# Patient Record
Sex: Female | Born: 1985 | Hispanic: Yes | Marital: Single | Smoking: Never smoker
Health system: Southern US, Community
[De-identification: ages and names within clinical notes are randomized; demographics above are authoritative.]

## PROBLEM LIST (undated history)

## (undated) ENCOUNTER — Inpatient Hospital Stay (HOSPITAL_COMMUNITY): Payer: Self-pay

## (undated) DIAGNOSIS — F32A Depression, unspecified: Secondary | ICD-10-CM

## (undated) DIAGNOSIS — R42 Dizziness and giddiness: Secondary | ICD-10-CM

## (undated) HISTORY — DX: Depression, unspecified: F32.A

---

## 2015-08-04 ENCOUNTER — Emergency Department (HOSPITAL_COMMUNITY)
Admission: EM | Admit: 2015-08-04 | Discharge: 2015-08-04 | Disposition: A | Discharge status: Home / Self Care | Payer: Medicaid Other | Source: Home / Self Care | Attending: Family Medicine | Admitting: Family Medicine

## 2015-08-04 ENCOUNTER — Encounter (HOSPITAL_COMMUNITY): Payer: Self-pay | Admitting: Emergency Medicine

## 2015-08-04 DIAGNOSIS — R6889 Other general symptoms and signs: Secondary | ICD-10-CM

## 2015-08-04 NOTE — Discharge Instructions (Signed)
For nasal and head congestion may take Sudafed PE 10 mg every 4 hours as needed. Saline nasal spray used frequently. For drainage may use Allegra, Claritin or Zyrtec. If you need stronger medicine to stop drainage may take Chlor-Trimeton 2-4 mg every 4 hours. This may cause drowsiness. OR You may take OTC NYQUIL and  Ibuprofen 600 mg every 6 hours as needed for pain, discomfort or fever. Drink plenty of fluids and stay well-hydrated.

## 2015-08-04 NOTE — ED Provider Notes (Signed)
CSN: [id number]     Arrival date & time 08/04/15  1317 History   First MD Initiated Contact with Patient 08/04/15 1603     Chief Complaint  Patient presents with  . Chills  . Sinus Problem   (Consider location/radiation/quality/duration/timing/severity/associated sxs/prior Treatment) HPI Comments: 30 year old female complaining of onset of fever, headache, chills, decreased appetite and body aches yesterday. She has been taking Advil. MAXIMUM TEMPERATURE 101. Denies earache or sore throat.   History reviewed. No pertinent past medical history. Past Surgical History  Procedure Laterality Date  . Cesarean section     No family history on file. Social History  Substance Use Topics  . Smoking status: Never Smoker   . Smokeless tobacco: None  . Alcohol Use: No   OB History    No data available     Review of Systems  Constitutional: Positive for fever, activity change, appetite change and fatigue. Negative for chills.  HENT: Positive for congestion, postnasal drip and rhinorrhea. Negative for ear pain, facial swelling and sore throat.   Eyes: Negative.   Respiratory: Positive for cough. Negative for chest tightness and shortness of breath.   Cardiovascular: Negative.   Gastrointestinal: Negative.   Genitourinary: Negative.   Musculoskeletal: Negative for neck pain and neck stiffness.  Skin: Negative for pallor and rash.  Neurological: Negative.     Allergies  Penicillins  Home Medications   Prior to Admission medications   Medication Sig Start Date End Date Taking? Authorizing Provider  promethazine (PHENERGAN) 25 MG tablet Take 25 mg by mouth every 6 (six) hours as needed for nausea or vomiting.    Historical Provider, MD   Meds Ordered and Administered this Visit  Medications - No data to display  BP 114/78 mmHg  Pulse 67  Temp(Src) 98.5 F (36.9 C) (Oral)  SpO2 99%  LMP 07/26/2015 No data found.   Physical Exam  Constitutional: She is oriented to person,  place, and time. She appears well-developed and well-nourished. No distress.  HENT:  Head: Normocephalic and atraumatic.  Mouth/Throat: Oropharynx is clear and moist. No oropharyngeal exudate.  Bilateral TMs are normal. Oropharynx with minimal erythema. No exudates or swelling.  Eyes: Conjunctivae and EOM are normal. Pupils are equal, round, and reactive to light.  Neck: Normal range of motion. Neck supple.  Cardiovascular: Normal rate, regular rhythm and normal heart sounds.   Pulmonary/Chest: Effort normal and breath sounds normal. No respiratory distress. She has no wheezes. She has no rales.  Abdominal: Soft. There is no tenderness.  Musculoskeletal: Normal range of motion.  Lymphadenopathy:    She has no cervical adenopathy.  Neurological: She is alert and oriented to person, place, and time. No cranial nerve deficit. She exhibits normal muscle tone.  Skin: Skin is warm and dry.  Psychiatric: She has a normal mood and affect.  Nursing note and vitals reviewed.   ED Course  Procedures (including critical care time)  Labs Review Labs Reviewed - No data to display  Imaging Review No results found.   Visual Acuity Review  Right Eye Distance:   Left Eye Distance:   Bilateral Distance:    Right Eye Near:   Left Eye Near:    Bilateral Near:         MDM   1. Flu-like symptoms    For nasal and head congestion may take Sudafed PE 10 mg every 4 hours as needed. Saline nasal spray used frequently. For drainage may use Allegra, Claritin or Zyrtec. If you need  stronger medicine to stop drainage may take Chlor-Trimeton 2-4 mg every 4 hours. This may cause drowsiness. Or Nyquil and  Ibuprofen 600 mg every 6 hours as needed for pain, discomfort or fever. Drink plenty of fluids and stay well-hydrated.    Janne Napoleon, NP 08/04/15 [phone]

## 2015-08-04 NOTE — ED Notes (Signed)
Pt here with fever,chills and pressure behind eyes with headache that started yesterday Taking Advil foe temp 101 @ home Denies n/v or diarrhea

## 2016-02-09 ENCOUNTER — Ambulatory Visit (HOSPITAL_COMMUNITY)
Admission: EM | Admit: 2016-02-09 | Discharge: 2016-02-09 | Disposition: A | Discharge status: Home / Self Care | Payer: Medicaid Other | Attending: Family Medicine | Admitting: Family Medicine

## 2016-02-09 ENCOUNTER — Encounter (HOSPITAL_COMMUNITY): Payer: Self-pay | Admitting: Emergency Medicine

## 2016-02-09 DIAGNOSIS — J069 Acute upper respiratory infection, unspecified: Secondary | ICD-10-CM | POA: Insufficient documentation

## 2016-02-09 DIAGNOSIS — J029 Acute pharyngitis, unspecified: Secondary | ICD-10-CM | POA: Diagnosis present

## 2016-02-09 DIAGNOSIS — R05 Cough: Secondary | ICD-10-CM | POA: Diagnosis not present

## 2016-02-09 DIAGNOSIS — Z880 Allergy status to penicillin: Secondary | ICD-10-CM | POA: Diagnosis not present

## 2016-02-09 DIAGNOSIS — R059 Cough, unspecified: Secondary | ICD-10-CM

## 2016-02-09 HISTORY — DX: Dizziness and giddiness: R42

## 2016-02-09 MED ORDER — AZITHROMYCIN 250 MG PO TABS: 250.0000 mg | ORAL_TABLET | Freq: Every day | ORAL | 0 refills | Status: DC

## 2016-02-09 MED ORDER — HYDROCODONE-ACETAMINOPHEN 7.5-325 MG/15ML PO SOLN: 10.0000 mL | Freq: Four times a day (QID) | ORAL | 0 refills | Status: DC | PRN

## 2016-02-09 NOTE — Discharge Instructions (Signed)
PUSH LOTS OF FLUIDS  TYLENOL OR IBUPROFEN AS NEEDED.   USE MEDICATIONS AS DIRECTED.  IF WHEEZING DEVELOPS CALL BACK AS YOU MAY NEED AN INHALER.

## 2016-02-09 NOTE — ED Triage Notes (Signed)
The patient presented to the [facility] with a complete of a sore throat, cough, and body aches x 2 days.

## 2016-02-10 NOTE — ED Provider Notes (Signed)
CSN: [id number]     Arrival date & time 02/09/16  1930 History   First MD Initiated Contact with Patient 02/09/16 2019     Chief Complaint  Patient presents with  . Sore Throat  . Cough   (Consider location/radiation/quality/duration/timing/severity/associated sxs/prior Treatment) HPI Cough and a 30 year old female for the past 5-6 days. No relief with over-the-counter medications. Has been treated for bronchitis in the past. She is a nonsmoker. Score is 0. Cough keeping her up at night. She is producing a yellowish green sputum. Past Medical History:  Diagnosis Date  . Vertigo    Past Surgical History:  Procedure Laterality Date  . CESAREAN SECTION    . CESAREAN SECTION     History reviewed. No pertinent family history. Social History  Substance Use Topics  . Smoking status: Never Smoker  . Smokeless tobacco: Never Used  . Alcohol use No   OB History    No data available     Review of Systems  Denies: HEADACHE, NAUSEA, ABDOMINAL PAIN, CHEST PAIN, CONGESTION, DYSURIA, SHORTNESS OF BREATH  Allergies  Penicillins  Home Medications   Prior to Admission medications   Medication Sig Start Date End Date Taking? Authorizing Provider  azithromycin (ZITHROMAX) 250 MG tablet Take 1 tablet (250 mg total) by mouth daily. Take first 2 tablets together, then 1 every day until finished. 02/09/16   Konrad Felix, PA  HYDROcodone-acetaminophen (HYCET) 7.5-325 mg/15 ml solution Take 10 mLs by mouth 4 (four) times daily as needed for moderate pain. 02/09/16   Konrad Felix, PA  promethazine (PHENERGAN) 25 MG tablet Take 25 mg by mouth every 6 (six) hours as needed for nausea or vomiting.    Historical Provider, MD   Meds Ordered and Administered this Visit  Medications - No data to display  BP 108/73 (BP Location: Right Arm)   Pulse 85   Temp 98.7 F (37.1 C) (Oral)   Resp 16   LMP 02/07/2016 (Exact Date)   SpO2 99%  No data found.   Physical Exam NURSES NOTES AND VITAL  SIGNS REVIEWED. CONSTITUTIONAL: Well developed, well nourished, no acute distress HEENT: normocephalic, atraumatic EYES: Conjunctiva normal NECK:normal ROM, supple, no adenopathy PULMONARY:No respiratory distress, normal effort ABDOMINAL: Soft, ND, NT BS+, No CVAT MUSCULOSKELETAL: Normal ROM of all extremities,  SKIN: warm and dry without rash PSYCHIATRIC: Mood and affect, behavior are normal  Urgent Care Course   Clinical Course    Procedures (including critical care time)  Labs Review Labs Reviewed  CULTURE, GROUP A STREP [facility])    Imaging Review No results found.   Visual Acuity Review  Right Eye Distance:   Left Eye Distance:   Bilateral Distance:    Right Eye Near:   Left Eye Near:    Bilateral Near:         MDM   1. URI (upper respiratory infection)   2. Cough     Patient is reassured that there are no issues that require transfer to higher level of care at this time or additional tests. Patient is advised to continue home symptomatic treatment. Patient is advised that if there are new or worsening symptoms to attend the emergency department, contact primary care provider, or return to UC. Instructions of care provided discharged home in stable condition.    THIS NOTE WAS GENERATED USING A VOICE RECOGNITION SOFTWARE PROGRAM. ALL REASONABLE EFFORTS  WERE MADE TO PROOFREAD THIS DOCUMENT FOR ACCURACY.  I have verbally reviewed the discharge instructions with the  patient. A printed AVS was given to the patient.  All questions were answered prior to discharge.      Konrad Felix, PA 02/10/16 2059

## 2016-02-12 LAB — CULTURE, GROUP A STREP (THRC)

## 2016-02-28 ENCOUNTER — Ambulatory Visit (HOSPITAL_COMMUNITY)
Admission: EM | Admit: 2016-02-28 | Discharge: 2016-02-28 | Disposition: A | Discharge status: Home / Self Care | Payer: Medicaid Other | Attending: Family Medicine | Admitting: Family Medicine

## 2016-02-28 ENCOUNTER — Encounter (HOSPITAL_COMMUNITY): Payer: Self-pay | Admitting: Emergency Medicine

## 2016-02-28 DIAGNOSIS — R05 Cough: Secondary | ICD-10-CM | POA: Diagnosis not present

## 2016-02-28 DIAGNOSIS — R059 Cough, unspecified: Secondary | ICD-10-CM

## 2016-02-28 MED ORDER — ALBUTEROL SULFATE HFA 108 (90 BASE) MCG/ACT IN AERS: 2.0000 | INHALATION_SPRAY | Freq: Four times a day (QID) | RESPIRATORY_TRACT | 0 refills | Status: DC | PRN

## 2016-02-28 MED ORDER — HYDROCODONE-ACETAMINOPHEN 7.5-325 MG/15ML PO SOLN: 10.0000 mL | Freq: Four times a day (QID) | ORAL | 0 refills | Status: DC | PRN

## 2016-02-28 NOTE — ED Provider Notes (Signed)
CSN: [id number]     Arrival date & time 02/28/16  1258 History   First MD Initiated Contact with Patient 02/28/16 1446     Chief Complaint  Patient presents with  . Cough   (Consider location/radiation/quality/duration/timing/severity/associated sxs/prior Treatment) [name] is a well-appearing 30 y.o female, presents today for cough. Patient was seem on 09/11 for the same in addition to other URI symptoms. Patient was given z-pack and Hycet on her 09/11 visit. She reports feeling better, URI symptoms have resolved but cough is lingering on. Cough is dry. Reports the hycet was working for cough but she ran out of the cough syrup. Patient also requesting a rx for albuterol inhaler for her cough. She denies SOB.       Past Medical History:  Diagnosis Date  . Vertigo    Past Surgical History:  Procedure Laterality Date  . CESAREAN SECTION    . CESAREAN SECTION     History reviewed. No pertinent family history. Social History  Substance Use Topics  . Smoking status: Never Smoker  . Smokeless tobacco: Never Used  . Alcohol use No   OB History    No data available     Review of Systems  Constitutional: Positive for fatigue. Negative for chills and fever.  HENT: Positive for sore throat. Negative for congestion, rhinorrhea and sneezing.   Respiratory: Positive for cough. Negative for shortness of breath and wheezing.   Cardiovascular: Negative for chest pain, palpitations and leg swelling.  Gastrointestinal: Negative for abdominal pain, diarrhea, nausea and vomiting.  Neurological: Negative for dizziness and headaches.    Allergies  Penicillins  Home Medications   Prior to Admission medications   Medication Sig Start Date End Date Taking? Authorizing Provider  albuterol (PROVENTIL HFA;VENTOLIN HFA) 108 (90 Base) MCG/ACT inhaler Inhale 2 puffs into the lungs every 6 (six) hours as needed. 02/28/16 03/04/16  Barry Dienes, NP  azithromycin (ZITHROMAX) 250 MG tablet Take 1  tablet (250 mg total) by mouth daily. Take first 2 tablets together, then 1 every day until finished. Patient not taking: Reported on 02/28/2016 02/09/16   Konrad Felix, PA  HYDROcodone-acetaminophen (HYCET) 7.5-325 mg/15 ml solution Take 10 mLs by mouth 4 (four) times daily as needed for moderate pain. 02/28/16   Barry Dienes, NP  promethazine (PHENERGAN) 25 MG tablet Take 25 mg by mouth every 6 (six) hours as needed for nausea or vomiting.    Historical Provider, MD   Meds Ordered and Administered this Visit  Medications - No data to display  BP 122/70 (BP Location: Left Arm)   Pulse 80   Temp 98.5 F (36.9 C) (Oral)   Resp 17   LMP 02/07/2016 (Exact Date)   SpO2 100%  No data found.   Physical Exam  Constitutional: She is oriented to person, place, and time. She appears well-developed and well-nourished.  HENT:  Head: Normocephalic and atraumatic.  Right Ear: External ear normal.  Left Ear: External ear normal.  Nose: Nose normal.  Mouth/Throat: Oropharynx is clear and moist. No oropharyngeal exudate.  TM pearly gray with no erythema  Eyes: EOM are normal. Pupils are equal, round, and reactive to light.  Neck: Normal range of motion.  Cardiovascular: Normal rate, regular rhythm and normal heart sounds.   Pulmonary/Chest: Effort normal and breath sounds normal.  Abdominal: Soft. Bowel sounds are normal. She exhibits no distension. There is no tenderness.  Lymphadenopathy:    She has no cervical adenopathy.  Neurological: She is alert and  oriented to person, place, and time.  Skin: Skin is warm and dry.  Nursing note and vitals reviewed.   Urgent Care Course   Clinical Course    Procedures (including critical care time)  Labs Review Labs Reviewed - No data to display  Imaging Review No results found.    MDM   1. Cough    Patient is well-appearing. Physical examination normal. Refill for hycet given. Rx for albuterol also given to help with post-infectious  cough. I didn't feel that a chest xray was necessary today. Instructed to return if she continues to not improve.    Barry Dienes, NP 02/28/16 [phone]

## 2016-02-28 NOTE — Discharge Instructions (Signed)
Take the prescription medication as prescribed as needed for coughing. Please return to urgent care if you continues to not improve.

## 2016-02-28 NOTE — ED Triage Notes (Signed)
Patient has a cough since 9/11.  Patient reports other symptoms from this visit have resolved.  Patient has a lingering cough.  Cough makes chest and upper back sore.

## 2016-04-14 ENCOUNTER — Encounter: Payer: Self-pay | Admitting: Pulmonary Disease

## 2016-04-14 ENCOUNTER — Ambulatory Visit (INDEPENDENT_AMBULATORY_CARE_PROVIDER_SITE_OTHER): Payer: Medicaid Other | Admitting: Pulmonary Disease

## 2016-04-14 DIAGNOSIS — R0600 Dyspnea, unspecified: Secondary | ICD-10-CM | POA: Insufficient documentation

## 2016-04-14 DIAGNOSIS — R0602 Shortness of breath: Secondary | ICD-10-CM

## 2016-04-14 LAB — NITRIC OXIDE: NITRIC OXIDE: 7

## 2016-04-14 NOTE — Progress Notes (Signed)
Jillian Mercer    [id number]    1986-04-17  Primary Care Physician:Takela Brandt [place], [place]  Referring Physician: Vonna Drafts, [address] [place], [place] [id number]  Chief complaint:  Consult for evaluation of recurrent bronchitis  HPI: [name] is a 30 year old with no significant past medical history. She has recurrent bronchitis. About 2 episodes every year with paroxysmal cough, clear mucus production, dyspnea, wheezing, chest tightness. She denies any fevers, chills. Her last episode was about 2 months ago which was treated with a course of prednisone which helped with the symptoms. She is also given an albuterol inhaler which does not help much. A chest x-ray earlier this month which did not show any acute abnormality.  She has seasonal allergies but denies any perineal rhinitis, postnasal drip. She does not have any symptoms of GERD, heartburn. She has a dog at home but there is not sensitive to it. There is no mold issues at home.   Outpatient Encounter Prescriptions as of 04/14/2016  Medication Sig  . albuterol (PROVENTIL HFA;VENTOLIN HFA) 108 (90 Base) MCG/ACT inhaler Inhale 2 puffs into the lungs every 6 (six) hours as needed.  Marland Kitchen HYDROcodone-acetaminophen (HYCET) 7.5-325 mg/15 ml solution Take 10 mLs by mouth 4 (four) times daily as needed for moderate pain.  . promethazine (PHENERGAN) 25 MG tablet Take 25 mg by mouth every 6 (six) hours as needed for nausea or vomiting.  . [DISCONTINUED] azithromycin (ZITHROMAX) 250 MG tablet Take 1 tablet (250 mg total) by mouth daily. Take first 2 tablets together, then 1 every day until finished. (Patient not taking: Reported on 04/14/2016)   No facility-administered encounter medications on file as of 04/14/2016.     Allergies as of 04/14/2016 - Review Complete 04/14/2016  Allergen Reaction Noted  . Penicillins  08/04/2015    Past Medical History:  Diagnosis Date  . Vertigo     Past Surgical  History:  Procedure Laterality Date  . CESAREAN SECTION  2007    Family History  Problem Relation Age of Onset  . Lupus Mother   . Skin cancer Maternal Grandmother   . Hypertension Maternal Grandmother     Social History   Social History  . Marital status: Single    Spouse name: N/A  . Number of children: N/A  . Years of education: N/A   Occupational History  . Not on file.   Social History Main Topics  . Smoking status: Never Smoker  . Smokeless tobacco: Never Used  . Alcohol use No  . Drug use: No  . Sexual activity: Not on file   Other Topics Concern  . Not on file   Social History Narrative   Single   Lives with fiance and her children    3 children   OCCUPATION: stay at home mom   TRAVEL: [place] Nov 2017 for 1 week     Review of systems: Review of Systems  Constitutional: Negative for fever and chills.  HENT: Negative.   Eyes: Negative for blurred vision.  Respiratory: as per HPI  Cardiovascular: Negative for chest pain and palpitations.  Gastrointestinal: Negative for vomiting, diarrhea, blood per rectum. Genitourinary: Negative for dysuria, urgency, frequency and hematuria.  Musculoskeletal: Negative for myalgias, back pain and joint pain.  Skin: Negative for itching and rash.  Neurological: Negative for dizziness, tremors, focal weakness, seizures and loss of consciousness.  Endo/Heme/Allergies: Negative for environmental allergies.  Psychiatric/Behavioral: Negative for depression, suicidal ideas  and hallucinations.  All other systems reviewed and are negative.   Physical Exam: Blood pressure 118/72, pulse 69, height 5\' 5"  (1.651 m), weight 250 lb 3.2 oz (113.5 kg), SpO2 99 %. Gen:      No acute distress HEENT:  EOMI, sclera anicteric Neck:     No masses; no thyromegaly Lungs:    Clear to auscultation bilaterally; normal respiratory effort CV:         Regular rate and rhythm; no murmurs Abd:      + bowel sounds; soft, non-tender; no palpable  masses, no distension Ext:    No edema; adequate peripheral perfusion Skin:      Warm and dry; no rash Neuro: alert and oriented x 3 Psych: normal mood and affect  Data Reviewed: CXR 03/31/16- no acute cardiopulmonary and a multi.  Assessment:  Asthmatic bronchitis. I'll evaluate by getting an FENO, CBC with diff and blood allergy profile. There does not appear to be big component of upper airway cough from postnasal drip or GERD. She'll continue her albuterol inhaler. She may need to be on a long-term controller medication. I'll get pulmonary function tests. She'll return to clinic in 1 month to review the tests and decide on further steps  Plan/Recommendations: - PFTs - CBC with diff, blood allergy profile, FENO - Continue albuterol inhaler.  Marshell Garfinkel MD [facility] Pager [phone] 04/14/2016, 3:17 PM  CC: Vonna Drafts, FNP

## 2016-04-14 NOTE — Patient Instructions (Signed)
We will get an FENO, CBC with differential and a blood allergy profile. You will be scheduled for pulmonary function test. Continue using the albuterol rescue inhaler.  Return to clinic in 1 month.

## 2016-06-17 ENCOUNTER — Ambulatory Visit: Payer: Medicaid Other | Admitting: Pulmonary Disease

## 2016-12-24 ENCOUNTER — Ambulatory Visit: Payer: Medicaid Other | Admitting: Emergency Medicine

## 2016-12-28 ENCOUNTER — Encounter: Payer: Self-pay | Admitting: Adult Health

## 2016-12-28 ENCOUNTER — Other Ambulatory Visit (INDEPENDENT_AMBULATORY_CARE_PROVIDER_SITE_OTHER): Payer: BLUE CROSS/BLUE SHIELD

## 2016-12-28 ENCOUNTER — Ambulatory Visit (INDEPENDENT_AMBULATORY_CARE_PROVIDER_SITE_OTHER): Payer: BLUE CROSS/BLUE SHIELD | Admitting: Adult Health

## 2016-12-28 ENCOUNTER — Ambulatory Visit (INDEPENDENT_AMBULATORY_CARE_PROVIDER_SITE_OTHER)
Admission: RE | Admit: 2016-12-28 | Discharge: 2016-12-28 | Disposition: A | Discharge status: Home / Self Care | Payer: BLUE CROSS/BLUE SHIELD | Source: Ambulatory Visit | Attending: Adult Health | Admitting: Adult Health

## 2016-12-28 VITALS — BP 108/66 | HR 72 | Ht 65.0 in | Wt 237.0 lb

## 2016-12-28 DIAGNOSIS — R059 Cough, unspecified: Secondary | ICD-10-CM

## 2016-12-28 DIAGNOSIS — R05 Cough: Secondary | ICD-10-CM

## 2016-12-28 LAB — CBC WITH DIFFERENTIAL/PLATELET
BASOS ABS: 0.1 10*3/uL (ref 0.0–0.1)
Basophils Relative: 1 % (ref 0.0–3.0)
EOS ABS: 0.1 10*3/uL (ref 0.0–0.7)
Eosinophils Relative: 1.5 % (ref 0.0–5.0)
HCT: 39.9 % (ref 36.0–46.0)
HEMOGLOBIN: 12.9 g/dL (ref 12.0–15.0)
Lymphocytes Relative: 24.2 % (ref 12.0–46.0)
Lymphs Abs: 1.4 10*3/uL (ref 0.7–4.0)
MCHC: 32.2 g/dL (ref 30.0–36.0)
MCV: 82.3 fl (ref 78.0–100.0)
MONO ABS: 0.4 10*3/uL (ref 0.1–1.0)
Monocytes Relative: 6.3 % (ref 3.0–12.0)
Neutro Abs: 4 10*3/uL (ref 1.4–7.7)
Neutrophils Relative %: 67 % (ref 43.0–77.0)
Platelets: 333 10*3/uL (ref 150.0–400.0)
RBC: 4.85 Mil/uL (ref 3.87–5.11)
RDW: 16.6 % — ABNORMAL HIGH (ref 11.5–15.5)
WBC: 6 10*3/uL (ref 4.0–10.5)

## 2016-12-28 MED ORDER — BENZONATATE 200 MG PO CAPS: 200.0000 mg | ORAL_CAPSULE | Freq: Three times a day (TID) | ORAL | 3 refills | Status: DC | PRN

## 2016-12-28 NOTE — Patient Instructions (Addendum)
Begin Delsym 2 teaspoons twice daily as needed for cough. Tessalon 3 times a day as needed for cough Begin Zyrtec 10 mg daily Begin Chlor-Trimeton 4 mg 2 at bedtime Labs and chest x-ray today Follow-up with Dr. Vaughan Browner in 6-8 weeks with PFT and As needed   Please contact office for sooner follow up if symptoms do not improve or worsen or seek emergency care

## 2016-12-28 NOTE — Assessment & Plan Note (Signed)
Upper airway cough syndrome -/post bronchitic cough  Will treat for triggers. And cough control .  Check cxr , cbc w/ diff, allergy profile Nml spirometry . PFT ordered.   Plan  Patient Instructions  Begin Delsym 2 teaspoons twice daily as needed for cough. Tessalon 3 times a day as needed for cough Begin Zyrtec 10 mg daily Begin Chlor-Trimeton 4 mg 2 at bedtime Labs and chest x-ray today Follow-up with Dr. Vaughan Browner in 6-8 weeks with PFT and As needed   Please contact office for sooner follow up if symptoms do not improve or worsen or seek emergency care

## 2016-12-28 NOTE — Progress Notes (Signed)
@Patient  ID: Jillian Mercer, female    DOB: 1985/12/18, 31 y.o.   MRN: [id number]  Chief Complaint  Patient presents with  . Acute Visit    cough     Referring provider: Vonna Drafts, FNP  HPI: 31 year old female followed for recurrent bronchitis, allergic rhinitis  TEST  03/2016 Nitric Oxide 7   12/28/2016 Acute OV : Cough  Pt presents for persistent cough . Says she was treated for acute bronchitis 6 weeks ago in [place] while on vacation . She was given Abx, albuterol inhaler and prednisone which helped. She felt better but still has dry cough . Says that whenever she gets a cold, or URI keeps a cough for several weeks before it goes away.  She was seen for similar symptoms in 03/2016, she was recommended to follow up for PFT but did not return because cough went away .  She is is a never smoker. She is a Careers information officer.  She is not taking any meds for cough .  Spirometry today in office is normal with no airflow obstruction or restriction. Denies reflux. Has some sinus drainage.     Allergies  Allergen Reactions  . Penicillins     Immunization History  Administered Date(s) Administered  . Influenza Split 03/31/2016    Past Medical History:  Diagnosis Date  . Vertigo     Tobacco History: History  Smoking Status  . Never Smoker  Smokeless Tobacco  . Never Used   Counseling given: Not Answered   Outpatient Encounter Prescriptions as of 12/28/2016  Medication Sig  . albuterol (PROVENTIL HFA;VENTOLIN HFA) 108 (90 Base) MCG/ACT inhaler Inhale 2 puffs into the lungs every 6 (six) hours as needed.  . meclizine (ANTIVERT) 25 MG tablet Take 25 mg by mouth 3 (three) times daily as needed for dizziness.  . promethazine (PHENERGAN) 25 MG tablet Take 25 mg by mouth every 6 (six) hours as needed for nausea or vomiting.  . benzonatate (TESSALON) 200 MG capsule Take 1 capsule (200 mg total) by mouth 3 (three) times daily as needed for cough.  . liraglutide (VICTOZA) 18  MG/3ML SOPN Inject 18 mg into the skin daily.  . [DISCONTINUED] HYDROcodone-acetaminophen (HYCET) 7.5-325 mg/15 ml solution Take 10 mLs by mouth 4 (four) times daily as needed for moderate pain. (Patient not taking: Reported on 12/28/2016)   No facility-administered encounter medications on file as of 12/28/2016.      Review of Systems  Constitutional:   No  weight loss, night sweats,  Fevers, chills, fatigue, or  lassitude.  HEENT:   No headaches,  Difficulty swallowing,  Tooth/dental problems, or  Sore throat,                No sneezing, itching, ear ache, + nasal congestion, post nasal drip,   CV:  No chest pain,  Orthopnea, PND, swelling in lower extremities, anasarca, dizziness, palpitations, syncope.   GI  No heartburn, indigestion, abdominal pain, nausea, vomiting, diarrhea, change in bowel habits, loss of appetite, bloody stools.   Resp: No shortness of breath with exertion or at rest.  No excess mucus, no productive cough,  No non-productive cough,  No coughing up of blood.  No change in color of mucus.  No wheezing.  No chest wall deformity  Skin: no rash or lesions.  GU: no dysuria, change in color of urine, no urgency or frequency.  No flank pain, no hematuria   MS:  No joint pain or swelling.  No decreased  range of motion.  No back pain.    Physical Exam  BP 108/66 (BP Location: Right Arm, Cuff Size: Normal)   Pulse 72   Ht 5\' 5"  (1.651 m)   Wt 237 lb (107.5 kg)   LMP 12/05/2016   SpO2 99%   BMI 39.44 kg/m   GEN: A/Ox3; pleasant , NAD, well nourished    HEENT:  [place]/AT,  EACs-clear, TMs-wnl, NOSE-clear, THROAT-clear, no lesions, no postnasal drip or exudate noted.   NECK:  Supple w/ fair ROM; no JVD; normal carotid impulses w/o bruits; no thyromegaly or nodules palpated; no lymphadenopathy.    RESP  Clear  P & A; w/o, wheezes/ rales/ or rhonchi. no accessory muscle use, no dullness to percussion  CARD:  RRR, no m/r/g, no peripheral edema, pulses intact, no  cyanosis or clubbing.  GI:   Soft & nt; nml bowel sounds; no organomegaly or masses detected.   Musco: Warm bil, no deformities or joint swelling noted.   Neuro: alert, no focal deficits noted.    Skin: Warm, no lesions or rashes   Lab Results:  CBC No results found for: WBC, RBC, HGB, HCT, PLT, MCV, MCH, MCHC, RDW, LYMPHSABS, MONOABS, EOSABS, BASOSABS  BMET No results found for: NA, K, CL, CO2, GLUCOSE, BUN, CREATININE, CALCIUM, GFRNONAA, GFRAA  BNP No results found for: BNP  ProBNP No results found for: PROBNP  Imaging: Dg Chest 2 View  Result Date: 12/28/2016 CLINICAL DATA:  Shortness of breath with cough and wheezing EXAM: CHEST  2 VIEW COMPARISON:  None. FINDINGS: Lungs are clear. Heart size and pulmonary vascularity are normal. No adenopathy. No bone lesions. IMPRESSION: No edema or consolidation. Electronically Signed   By: Lowella Grip III M.D.   On: 12/28/2016 13:05     Assessment & Plan:   Cough Upper airway cough syndrome -/post bronchitic cough  Will treat for triggers. And cough control .  Check cxr , cbc w/ diff, allergy profile Nml spirometry . PFT ordered.   Plan  Patient Instructions  Begin Delsym 2 teaspoons twice daily as needed for cough. Tessalon 3 times a day as needed for cough Begin Zyrtec 10 mg daily Begin Chlor-Trimeton 4 mg 2 at bedtime Labs and chest x-ray today Follow-up with Dr. Vaughan Browner in 6-8 weeks with PFT and As needed   Please contact office for sooner follow up if symptoms do not improve or worsen or seek emergency care           Rexene Edison, NP 12/28/2016

## 2016-12-29 LAB — RESPIRATORY ALLERGY PROFILE REGION II ~~LOC~~
Allergen, A. alternata, m6: <0.1 kU/L
Allergen, Cedar tree, t12: <0.1 kU/L
Allergen, Mouse Urine Protein, e78: <0.1 kU/L
Allergen, P. notatum, m1: <0.1 kU/L
Aspergillus fumigatus, m3: <0.1 kU/L
Box Elder IgE: <0.1 kU/L
Common Ragweed: <0.1 kU/L
D. farinae: <0.1 kU/L
Johnson Grass: <0.1 kU/L
Pecan/Hickory Tree IgE: <0.1 kU/L
Rough Pigweed  IgE: <0.1 kU/L
Timothy Grass: <0.1 kU/L

## 2017-03-01 ENCOUNTER — Ambulatory Visit: Payer: BLUE CROSS/BLUE SHIELD | Admitting: Pulmonary Disease

## 2017-03-01 ENCOUNTER — Encounter: Payer: BLUE CROSS/BLUE SHIELD | Admitting: Pulmonary Disease

## 2018-02-18 ENCOUNTER — Encounter (HOSPITAL_COMMUNITY): Payer: Self-pay | Admitting: *Deleted

## 2018-02-18 ENCOUNTER — Inpatient Hospital Stay (HOSPITAL_COMMUNITY)
Admission: AD | Admit: 2018-02-18 | Discharge: 2018-02-18 | Disposition: A | Discharge status: Home / Self Care | Payer: Medicaid Other | Attending: Obstetrics & Gynecology | Admitting: Obstetrics & Gynecology

## 2018-02-18 DIAGNOSIS — O209 Hemorrhage in early pregnancy, unspecified: Secondary | ICD-10-CM | POA: Insufficient documentation

## 2018-02-18 DIAGNOSIS — Z3A10 10 weeks gestation of pregnancy: Secondary | ICD-10-CM | POA: Diagnosis not present

## 2018-02-18 DIAGNOSIS — O4691 Antepartum hemorrhage, unspecified, first trimester: Secondary | ICD-10-CM

## 2018-02-18 DIAGNOSIS — O26899 Other specified pregnancy related conditions, unspecified trimester: Secondary | ICD-10-CM

## 2018-02-18 DIAGNOSIS — Z6791 Unspecified blood type, Rh negative: Secondary | ICD-10-CM | POA: Diagnosis not present

## 2018-02-18 DIAGNOSIS — O2340 Unspecified infection of urinary tract in pregnancy, unspecified trimester: Secondary | ICD-10-CM

## 2018-02-18 DIAGNOSIS — O26891 Other specified pregnancy related conditions, first trimester: Secondary | ICD-10-CM | POA: Diagnosis not present

## 2018-02-18 DIAGNOSIS — O2341 Unspecified infection of urinary tract in pregnancy, first trimester: Secondary | ICD-10-CM | POA: Diagnosis not present

## 2018-02-18 DIAGNOSIS — Z880 Allergy status to penicillin: Secondary | ICD-10-CM | POA: Diagnosis not present

## 2018-02-18 DIAGNOSIS — Z3490 Encounter for supervision of normal pregnancy, unspecified, unspecified trimester: Secondary | ICD-10-CM

## 2018-02-18 DIAGNOSIS — K5900 Constipation, unspecified: Secondary | ICD-10-CM | POA: Diagnosis not present

## 2018-02-18 DIAGNOSIS — N939 Abnormal uterine and vaginal bleeding, unspecified: Secondary | ICD-10-CM

## 2018-02-18 LAB — URINALYSIS, ROUTINE W REFLEX MICROSCOPIC
Bilirubin Urine: NEGATIVE
Glucose, UA: NEGATIVE mg/dL
Ketones, ur: NEGATIVE mg/dL
NITRITE: NEGATIVE
Protein, ur: NEGATIVE mg/dL
SPECIFIC GRAVITY, URINE: 1.023 (ref 1.005–1.030)
WBC, UA: >50 WBC/hpf — ABNORMAL HIGH (ref 0–5)
pH: 6 (ref 5.0–8.0)

## 2018-02-18 LAB — WET PREP, GENITAL
CLUE CELLS WET PREP: NONE SEEN
Sperm: NONE SEEN
TRICH WET PREP: NONE SEEN
YEAST WET PREP: NONE SEEN

## 2018-02-18 LAB — ABO/RH: ABO/RH(D): A NEG

## 2018-02-18 LAB — POCT PREGNANCY, URINE: Preg Test, Ur: POSITIVE — AB

## 2018-02-18 MED ORDER — CEPHALEXIN 500 MG PO CAPS: 500.0000 mg | ORAL_CAPSULE | Freq: Four times a day (QID) | ORAL | 0 refills | Status: DC

## 2018-02-18 MED ORDER — RHO D IMMUNE GLOBULIN 1500 UNIT/2ML IJ SOSY
300.0000 ug | PREFILLED_SYRINGE | Freq: Once | INTRAMUSCULAR | Status: AC
  Administered 2018-02-18: 300 ug via INTRAMUSCULAR
  Filled 2018-02-18: qty 2

## 2018-02-18 NOTE — MAU Note (Addendum)
Jillian Mercer is a 32 y.o. at [redacted]w[redacted]d here in MAU reporting: vaginal bleeding and lower mid abdominal pain. LMP: 12/08/17. Has not started care yet. Onset of complaint: both symptoms started this morning. Reports spotting in nature. Not having to wear a pad. Noticed pink on toilet paper when she wiped. Does report some red bleeding in toilet. Denies recent intercourse. Pain score: 3/10. Paint is sharp. intermittent Vitals:   02/18/18 1100  BP: (!) 111/57  Pulse: 66  Resp: 18  Temp: 98.2 F (36.8 C)  SpO2: 99%   FHR: 166 via doppler Lab orders placed from triage: ua and pregnancy test

## 2018-02-18 NOTE — MAU Provider Note (Signed)
History     CSN: [id number]  Arrival date and time: 02/18/18 1042   First Provider Initiated Contact with Patient 02/18/18 1133      Chief Complaint  Patient presents with  . Vaginal Bleeding  . Abdominal Pain   HPI   Jillian Mercer is a 32 y.o. female [phone] @ [redacted]w[redacted]d here in MAU with vaginal bleeding. The bleeding started this morning. She has not had recent intercourse. She has had constipation and has not had a normal BM in a few days. She is taking colace daily which she feels is not working. The bleeding is a very small amount. The bleeding is brown, not bright red. She is not having to wear a pad. She has had lower abdominal pain since she found out she was pregnant. The pain comes and goes. She has not taken any medications for the pain and declines the need for pain medication today.   OB History    Gravida  4   Para  3   Term  3   Preterm      AB      Living  3     SAB      TAB      Ectopic      Multiple      Live Births              Past Medical History:  Diagnosis Date  . Vertigo     Past Surgical History:  Procedure Laterality Date  . CESAREAN SECTION  2007    Family History  Problem Relation Age of Onset  . Lupus Mother   . Skin cancer Maternal Grandmother   . Hypertension Maternal Grandmother     Social History   Tobacco Use  . Smoking status: Never Smoker  . Smokeless tobacco: Never Used  Substance Use Topics  . Alcohol use: No  . Drug use: No    Allergies:  Allergies  Allergen Reactions  . Penicillins     Medications Prior to Admission  Medication Sig Dispense Refill Last Dose  . albuterol (PROVENTIL HFA;VENTOLIN HFA) 108 (90 Base) MCG/ACT inhaler Inhale 2 puffs into the lungs every 6 (six) hours as needed. 1 Inhaler 0 More than a month at Unknown time  . benzonatate (TESSALON) 200 MG capsule Take 1 capsule (200 mg total) by mouth 3 (three) times daily as needed for cough. 45 capsule 3 More than a month at Unknown  time  . liraglutide (VICTOZA) 18 MG/3ML SOPN Inject 18 mg into the skin daily.   More than a month at Unknown time  . meclizine (ANTIVERT) 25 MG tablet Take 25 mg by mouth 3 (three) times daily as needed for dizziness.   More than a month at Unknown time  . promethazine (PHENERGAN) 25 MG tablet Take 25 mg by mouth every 6 (six) hours as needed for nausea or vomiting.   More than a month at Unknown time   Results for orders placed or performed during the hospital encounter of 02/18/18 (from the past 48 hour(s))  Urinalysis, Routine w reflex microscopic     Status: Abnormal   Collection Time: 02/18/18 11:04 AM  Result Value Ref Range   Color, Urine YELLOW YELLOW   APPearance HAZY (A) CLEAR   Specific Gravity, Urine 1.023 1.005 - 1.030   pH 6.0 5.0 - 8.0   Glucose, UA NEGATIVE NEGATIVE mg/dL   Hgb urine dipstick MODERATE (A) NEGATIVE   Bilirubin Urine NEGATIVE NEGATIVE   Ketones,  ur NEGATIVE NEGATIVE mg/dL   Protein, ur NEGATIVE NEGATIVE mg/dL   Nitrite NEGATIVE NEGATIVE   Leukocytes, UA SMALL (A) NEGATIVE   RBC / HPF 0-5 0 - 5 RBC/hpf   WBC, UA >50 (H) 0 - 5 WBC/hpf   Bacteria, UA RARE (A) NONE SEEN   Squamous Epithelial / LPF 0-5 0 - 5   Mucus PRESENT     Comment: Performed at [facility], [address] [place], [place] [address]  Pregnancy, urine POC     Status: Abnormal   Collection Time: 02/18/18 11:06 AM  Result Value Ref Range   Preg Test, Ur POSITIVE (A) NEGATIVE    Comment:        THE SENSITIVITY OF THIS METHODOLOGY IS >24 mIU/mL   Wet prep, genital     Status: Abnormal   Collection Time: 02/18/18 11:56 AM  Result Value Ref Range   Yeast Wet Prep HPF POC NONE SEEN NONE SEEN   Trich, Wet Prep NONE SEEN NONE SEEN   Clue Cells Wet Prep HPF POC NONE SEEN NONE SEEN   WBC, Wet Prep HPF POC MODERATE (A) NONE SEEN    Comment: MODERATE BACTERIA SEEN   Sperm NONE SEEN     Comment: Performed at [facility], [address] [place], [place] [address]  ABO/Rh      Status: None (Preliminary result)   Collection Time: 02/18/18 12:40 PM  Result Value Ref Range   ABO/RH(D)      A NEG Performed at [facility], [address], [place], [place] [address]   Rh IG workup (includes ABO/Rh)     Status: None (Preliminary result)   Collection Time: 02/18/18 12:40 PM  Result Value Ref Range   Gestational Age(Wks) 10    ABO/RH(D) A NEG    Antibody Screen NEG    Unit Number [id number]    Blood Component Type RHIG    Unit division 00    Status of Unit ISSUED    Transfusion Status      OK TO TRANSFUSE Performed at [facility], [address] [place], [place] [address]     Review of Systems Physical Exam   Blood pressure (!) 111/57, pulse 66, temperature 98.2 F (36.8 C), temperature source Oral, resp. rate 18, weight 113.9 kg, last menstrual period 12/08/2017, SpO2 99 %.  Physical Exam  Constitutional: She is oriented to person, place, and time. She appears well-developed and well-nourished. No distress.  HENT:  Head: Normocephalic.  Eyes: Pupils are equal, round, and reactive to light.  Neck: Neck supple.  GI: Soft. Normal appearance. There is generalized tenderness. There is no rigidity, no rebound and no guarding.  Genitourinary:  Genitourinary Comments: Vagina - Small amount of milky brown vaginal discharge, no odor  Cervix - No contact bleeding, no active bleeding  Bimanual exam: Cervix closed, posterior  Uterus non tender, enlarged  Adnexa non tender, no masses bilaterally wet prep done Chaperone present for exam.   Musculoskeletal: Normal range of motion.  Neurological: She is alert and oriented to person, place, and time.  Skin: Skin is warm. She is not diaphoretic.  Psychiatric: Her behavior is normal.   MAU Course  Procedures  None  MDM  + fetal heart tones via doppler.  A negative blood type: Rhogam given today Wet prep UA and Urine culture  Assessment and Plan   A:  1. Vaginal spotting   2. Rh negative,  antepartum   3. Urinary tract infection in mother during pregnancy,  antepartum   4. [redacted] weeks gestation of pregnancy   5. Presence of fetal heart sounds, antepartum   6. Constipation, unspecified constipation type     P:  Discharge home in stable condition Bleeding precautions Pelvic rest List of OB's in the area provided, call and start care Rx: Keflex, urine culture pending Return to MAU if symptoms worsen Miralax clean out instructions provided Increase oral fluid intake  [place]on Merry, Artist Pais, NP 02/18/2018 2:44 PM

## 2018-02-18 NOTE — Discharge Instructions (Signed)
You have constipation which is hard stools that are difficult to pass. It is important to have regular bowel movements every 1-3 days that are soft and easy to pass. Hard stools increase your risk of hemorrhoids and are very uncomfortable.   To prevent constipation you can increase the amount of fiber in your diet. Examples of foods with fiber are leafy greens, whole grain breads, oatmeal and other grains.  It is also important to drink at least eight 8oz glass of water everyday.   If you have not has a bowel movement in 4-5 days you made need to clean out your bowel.  This will have establish normal movement through your bowel.    Miralax Clean out  Take 8 capfuls of miralax in 64 oz of gatorade. You can use any fluid that appeals to you (gatorade, water, juice)  Continue to drink at least eight 8 oz glasses of water throughout the day  You can repeat with another 8 capfuls of miralax in 64 oz of gatorade if you are not having a large amount of stools  You will need to be at home and close to a bathroom for about 8 hours when you do the above as you may need to go to the bathroom frequently.   After you are cleaned out: - Start Colace100mg  twice daily - Start Miralax once daily - Start a daily fiber supplement like metamucil or citrucel - You can safely use enemas in pregnancy  - if you are having diarrhea you can reduce to Colace once a day or miralax every other day or a 1/2 capful daily.     [facility] for [place] for [place] @ [facility]   Phone: [phone]  [facility] for [place] @ [place]   Phone: [place] [facility]  [facility]       Phone: [place] for [place] @ [employer]     Phone: [place] for [place] @ [employer]   Phone: [place] for [place] @ [facility]  Phone: [place]  [place])  Phone: [phone]

## 2018-02-19 LAB — RH IG WORKUP (INCLUDES ABO/RH)
ABO/RH(D): A NEG
Antibody Screen: NEGATIVE
GESTATIONAL AGE(WKS): 10
UNIT DIVISION: 0

## 2018-02-19 LAB — CULTURE, OB URINE: Special Requests: NORMAL

## 2018-05-31 NOTE — L&D Delivery Note (Signed)
Patient was C/C and pushed for <1 minutes with epidural.    NSVD female infant, Apgars 9/9, weight pending.   The patient had no laceration. Fundus was firm. EBL was expected amount. Placenta was delivered intact. Vagina was clear.  Delayed cord clamping was not performed- d/t preterm and NICU present. Baby was vigorous and doing skin to skin with mother.  Allyn Kenner

## 2018-07-09 ENCOUNTER — Inpatient Hospital Stay (HOSPITAL_BASED_OUTPATIENT_CLINIC_OR_DEPARTMENT_OTHER): Payer: Medicaid Other

## 2018-07-09 ENCOUNTER — Inpatient Hospital Stay (HOSPITAL_COMMUNITY)
Admission: AD | Admit: 2018-07-09 | Discharge: 2018-07-09 | Disposition: A | Discharge status: Home / Self Care | Payer: Medicaid Other | Attending: Obstetrics and Gynecology | Admitting: Obstetrics and Gynecology

## 2018-07-09 ENCOUNTER — Other Ambulatory Visit: Payer: Self-pay

## 2018-07-09 ENCOUNTER — Encounter (HOSPITAL_COMMUNITY): Payer: Self-pay | Admitting: *Deleted

## 2018-07-09 DIAGNOSIS — R109 Unspecified abdominal pain: Secondary | ICD-10-CM | POA: Insufficient documentation

## 2018-07-09 DIAGNOSIS — O23593 Infection of other part of genital tract in pregnancy, third trimester: Secondary | ICD-10-CM | POA: Insufficient documentation

## 2018-07-09 DIAGNOSIS — O3433 Maternal care for cervical incompetence, third trimester: Secondary | ICD-10-CM

## 2018-07-09 DIAGNOSIS — O4693 Antepartum hemorrhage, unspecified, third trimester: Secondary | ICD-10-CM | POA: Insufficient documentation

## 2018-07-09 DIAGNOSIS — Z880 Allergy status to penicillin: Secondary | ICD-10-CM | POA: Insufficient documentation

## 2018-07-09 DIAGNOSIS — O9989 Other specified diseases and conditions complicating pregnancy, childbirth and the puerperium: Secondary | ICD-10-CM | POA: Diagnosis not present

## 2018-07-09 DIAGNOSIS — B9689 Other specified bacterial agents as the cause of diseases classified elsewhere: Secondary | ICD-10-CM | POA: Insufficient documentation

## 2018-07-09 DIAGNOSIS — N760 Acute vaginitis: Secondary | ICD-10-CM | POA: Diagnosis not present

## 2018-07-09 DIAGNOSIS — Z3A30 30 weeks gestation of pregnancy: Secondary | ICD-10-CM | POA: Insufficient documentation

## 2018-07-09 LAB — WET PREP, GENITAL
Sperm: NONE SEEN
TRICH WET PREP: NONE SEEN
YEAST WET PREP: NONE SEEN

## 2018-07-09 LAB — FETAL FIBRONECTIN: FETAL FIBRONECTIN: NEGATIVE

## 2018-07-09 MED ORDER — BETAMETHASONE SOD PHOS & ACET 6 (3-3) MG/ML IJ SUSP
12.0000 mg | Freq: Once | INTRAMUSCULAR | Status: AC
  Administered 2018-07-09: 12 mg via INTRAMUSCULAR
  Filled 2018-07-09: qty 2

## 2018-07-09 MED ORDER — METRONIDAZOLE 500 MG PO TABS: 500.0000 mg | ORAL_TABLET | Freq: Two times a day (BID) | ORAL | 0 refills | Status: DC

## 2018-07-09 NOTE — MAU Provider Note (Signed)
History     CSN: [id number]  Arrival date and time: 07/09/18 0344   First Provider Initiated Contact with Patient 07/09/18 0411      Chief Complaint  Patient presents with  . Vaginal Bleeding  . Abdominal Pain   Jillian Mercer is a 33 y.o. [id number] at [redacted]w[redacted]d who presents for Vaginal Bleeding and Abdominal Pain.  She reports waking up around 11am and noted light pinkish-brown discharge with wiping.  She states it was later noted to be dark brown, but continued only to occur with wiping.  She states she had bleeding earlier in pregnancy around 10 weeks.  She denies clots or bright red bleeding, but endorses cramping that started around 3pm.  She states the cramping has been "off and on" and is located in the lower abdominal area. She has not taken anything for the cramping and states she has only had about 4 cups of water.   Patient denies vaginal discharge, itching, burning, or odor. She denies SI in the past 72 hours or anything in the vagina.  She endorses good fetal movement and denies contractions.       OB History    Gravida  4   Para  3   Term  3   Preterm      AB      Living  3     SAB      TAB      Ectopic      Multiple      Live Births              Past Medical History:  Diagnosis Date  . Vertigo     Past Surgical History:  Procedure Laterality Date  . CESAREAN SECTION  2007    Family History  Problem Relation Age of Onset  . Lupus Mother   . Skin cancer Maternal Grandmother   . Hypertension Maternal Grandmother     Social History   Tobacco Use  . Smoking status: Never Smoker  . Smokeless tobacco: Never Used  Substance Use Topics  . Alcohol use: No  . Drug use: No    Allergies:  Allergies  Allergen Reactions  . Penicillins     Medications Prior to Admission  Medication Sig Dispense Refill Last Dose  . famotidine (PEPCID) 10 MG tablet Take 10 mg by mouth 2 (two) times daily.   07/08/2018 at Unknown time  . Prenatal Vit-Fe  Fumarate-FA (PRENATAL MULTIVITAMIN) TABS tablet Take 1 tablet by mouth daily at 12 noon.   07/08/2018 at Unknown time  . promethazine (PHENERGAN) 25 MG tablet Take 25 mg by mouth every 6 (six) hours as needed for nausea or vomiting.   Past Week at Unknown time  . albuterol (PROVENTIL HFA;VENTOLIN HFA) 108 (90 Base) MCG/ACT inhaler Inhale 2 puffs into the lungs every 6 (six) hours as needed. 1 Inhaler 0 More than a month at Unknown time  . benzonatate (TESSALON) 200 MG capsule Take 1 capsule (200 mg total) by mouth 3 (three) times daily as needed for cough. 45 capsule 3 More than a month at Unknown time  . cephALEXin (KEFLEX) 500 MG capsule Take 1 capsule (500 mg total) by mouth 4 (four) times daily. 28 capsule 0   . meclizine (ANTIVERT) 25 MG tablet Take 25 mg by mouth 3 (three) times daily as needed for dizziness.   More than a month at Unknown time    Review of Systems Physical Exam   Blood pressure 111/62, pulse 69,  temperature 97.8 F (36.6 C), temperature source Oral, resp. rate 16, weight 117.5 kg, last menstrual period 12/08/2017.  Physical Exam  HENT:  Head: Normocephalic and atraumatic.  Eyes: Conjunctivae are normal.  Neck: Normal range of motion.  Cardiovascular: Normal rate.  Respiratory: Effort normal.  GI: Soft.  Genitourinary:    Vaginal bleeding present.     No vaginal discharge.  There is bleeding in the vagina.  Musculoskeletal: Normal range of motion.  Skin: Skin is warm and dry.  Psychiatric: She has a normal mood and affect. Her behavior is normal.    Fetal Assessment 130 bpm, Mod Var, -Decels, +Accels Toco: Q1-57min  MAU Course   Results for orders placed or performed during the hospital encounter of 07/09/18 (from the past 24 hour(s))  Wet prep, genital     Status: Abnormal   Collection Time: 07/09/18  4:27 AM  Result Value Ref Range   Yeast Wet Prep HPF POC NONE SEEN NONE SEEN   Trich, Wet Prep NONE SEEN NONE SEEN   Clue Cells Wet Prep HPF POC PRESENT  (A) NONE SEEN   WBC, Wet Prep HPF POC FEW (A) NONE SEEN   Sperm NONE SEEN   Fetal fibronectin     Status: None   Collection Time: 07/09/18  4:27 AM  Result Value Ref Range   Fetal Fibronectin NEGATIVE NEGATIVE    MDM PE Labs:UA, Wet Prep Limited US  Assessment and Plan  33 year old [id number] at 30.3wks Vaginal Bleeding Cramping  -Exam findings discussed -Discussed VE findings and need for further assessment -Wet prep and FFN collected -Plan to give BMZ -Send to [place] to assess fetal presentation -Will reassess  Follow Up (5:10 AM) Bacterial Vaginosis  -Preliminary report returns without significant findings and cephalic presentation. -Patient reports having 2 doses of Rhogam during this pregnancy. -BMZ given and plan to return tomorrow for 2nd dose. -Rx for Metronidazole sent to pharmacy on file for BV. -FHT remains reactive and Cat I -Give fluids PO -Will continue to monitor   Follow Up (5:47 AM) fFN returns negative  -Discussed modified bed rest and pelvic rest until next doctors appt on Wednesday Jul 12, 2018. -Questions and concerns addressed. -Contractions no longer graphed on toco. -Encouraged to call primary ob or return to MAU if symptoms worsen or with the onset of new symptoms. -Preterm labor precautions given.  -Discharged to home in stable condition.  Maryann Conners MSN, CNM 07/09/2018, 4:11 AM

## 2018-07-09 NOTE — MAU Note (Signed)
Pt presents to MAU c/o vaginal spoting and cramping. Pt states she noticed spotting at 2300 07/08/18 when she wiped after using the restroom (first the discharge was brow and then turned pink) since then she has started to have abdominal cramping. Pt reports the cramping that is a 4/10 on the pain scale. +FM. No other complaints.

## 2018-07-09 NOTE — Discharge Instructions (Signed)
Bacterial Vaginosis  Bacterial vaginosis is a vaginal infection that occurs when the normal balance of bacteria in the vagina is disrupted. It results from an overgrowth of certain bacteria. This is the most common vaginal infection among women ages 18-44. Because bacterial vaginosis increases your risk for STIs (sexually transmitted infections), getting treated can help reduce your risk for chlamydia, gonorrhea, herpes, and HIV (human immunodeficiency virus). Treatment is also important for preventing complications in pregnant women, because this condition can cause an early (premature) delivery. What are the causes? This condition is caused by an increase in harmful bacteria that are normally present in small amounts in the vagina. However, the reason that the condition develops is not fully understood. What increases the risk? The following factors may make you more likely to develop this condition:  Having a new sexual partner or multiple sexual partners.  Having unprotected sex.  Douching.  Having an intrauterine device (IUD).  Smoking.  Drug and alcohol abuse.  Taking certain antibiotic medicines.  Being pregnant. You cannot get bacterial vaginosis from toilet seats, bedding, swimming pools, or contact with objects around you. What are the signs or symptoms? Symptoms of this condition include:  Grey or white vaginal discharge. The discharge can also be watery or foamy.  A fish-like odor with discharge, especially after sexual intercourse or during menstruation.  Itching in and around the vagina.  Burning or pain with urination. Some women with bacterial vaginosis have no signs or symptoms. How is this diagnosed? This condition is diagnosed based on:  Your medical history.  A physical exam of the vagina.  Testing a sample of vaginal fluid under a microscope to look for a large amount of bad bacteria or abnormal cells. Your health care provider may use a cotton swab  or a small wooden spatula to collect the sample. How is this treated? This condition is treated with antibiotics. These may be given as a pill, a vaginal cream, or a medicine that is put into the vagina (suppository). If the condition comes back after treatment, a second round of antibiotics may be needed. Follow these instructions at home: Medicines  Take over-the-counter and prescription medicines only as told by your health care provider.  Take or use your antibiotic as told by your health care provider. Do not stop taking or using the antibiotic even if you start to feel better. General instructions  If you have a female sexual partner, tell her that you have a vaginal infection. She should see her health care provider and be treated if she has symptoms. If you have a female sexual partner, he does not need treatment.  During treatment: ? Avoid sexual activity until you finish treatment. ? Do not douche. ? Avoid alcohol as directed by your health care provider. ? Avoid breastfeeding as directed by your health care provider.  Drink enough water and fluids to keep your urine clear or pale yellow.  Keep the area around your vagina and rectum clean. ? Wash the area daily with warm water. ? Wipe yourself from front to back after using the toilet.  Keep all follow-up visits as told by your health care provider. This is important. How is this prevented?  Do not douche.  Wash the outside of your vagina with warm water only.  Use protection when having sex. This includes latex condoms and dental dams.  Limit how many sexual partners you have. To help prevent bacterial vaginosis, it is best to have sex with just one  partner (monogamous).  Make sure you and your sexual partner are tested for STIs.  Wear cotton or cotton-lined underwear.  Avoid wearing tight pants and pantyhose, especially during summer.  Limit the amount of alcohol that you drink.  Do not use any products that  contain nicotine or tobacco, such as cigarettes and e-cigarettes. If you need help quitting, ask your health care provider.  Do not use illegal drugs. Where to find more information  Centers for Disease Control and Prevention: AppraiserFraud.fi  American Sexual Health Association (ASHA): www.ashastd.org  [place].S. Department of Health and Financial controller, Office on Women's Health: DustingSprays.pl or SecuritiesCard.it Contact a health care provider if:  Your symptoms do not improve, even after treatment.  You have more discharge or pain when urinating.  You have a fever.  You have pain in your abdomen.  You have pain during sex.  You have vaginal bleeding between periods. Summary  Bacterial vaginosis is a vaginal infection that occurs when the normal balance of bacteria in the vagina is disrupted.  Because bacterial vaginosis increases your risk for STIs (sexually transmitted infections), getting treated can help reduce your risk for chlamydia, gonorrhea, herpes, and HIV (human immunodeficiency virus). Treatment is also important for preventing complications in pregnant women, because the condition can cause an early (premature) delivery.  This condition is treated with antibiotic medicines. These may be given as a pill, a vaginal cream, or a medicine that is put into the vagina (suppository). This information is not intended to replace advice given to you by your health care provider. Make sure you discuss any questions you have with your health care provider. Document Released: 05/17/2005 Document Revised: 09/20/2016 Document Reviewed: 01/31/2016 Elsevier Interactive Patient Education  2019 [employer]. Activity Restriction During Pregnancy Your health care provider may recommend specific activity restrictions during pregnancy for a variety of reasons. Activity restriction may require that you limit activities that require great effort,  such as exercise, lifting, or sex. The type of activity restriction will vary for each person, depending on your risk or the problems you are having. Activity restriction may be recommended for a period of time until your baby is delivered. Why are activity restrictions recommended? Activity restriction may be recommended if:  Your placenta is partially or completely covering the opening of your cervix (placenta previa).  There is bleeding between the wall of the uterus and the amniotic sac in the first trimester of pregnancy (subchorionic hemorrhage).  You went into labor too early (preterm labor).  You have a history of miscarriage.  You have a condition that causes high blood pressure during pregnancy (preeclampsia or eclampsia).  You are pregnant with more than one baby.  Your baby is not growing well. What are the risks? The risks depend on your specific restriction. Strict bed rest has the most physical and emotional risks and is no longer routinely recommended. Risks of strict bed rest include:  Loss of muscle conditioning from not moving.  Blood clots.  Social isolation.  Depression.  Loss of income. Talk with your health care team about activity restriction to decide if it is best for you and your baby. Even if you are having problems during your pregnancy, you may be able to continue with normal levels of activity with careful monitoring by your health care team. Follow these instructions at home: If needed, based on your overall health and the health of your baby, your health care provider will decide which type of activity restriction is right for  you. Activity restrictions may include:  Not lifting anything heavier than 10 pounds (4.5 kg).  Avoiding activities that take a lot of physical effort.  No lifting or straining.  Resting in a sitting position or lying down for periods of time during the day. Pelvic rest may be recommended along with activity  restrictions. If pelvic rest is recommended, then:  Do not have sex, an orgasm, or use sexual stimulation.  Do not use tampons. Do not douche. Do not put anything into your vagina.  Do not lift anything that is heavier than 10 lb (4.5 kg).  Avoid activities that require a lot of effort.  Avoid any activity in which your pelvic muscles could become strained, such as squatting. Questions to ask your health care provider  Why is my activity being limited?  How will activity restrictions affect my body?  Why is rest helpful for me and my baby?  What activities can I do?  When can I return to normal activities? When should I seek immediate medical care? Seek immediate medical care if you have:  Vaginal bleeding.  Vaginal discharge.  Cramping pain in your lower abdomen.  Regular contractions.  A low, dull backache. Summary  Your health care provider may recommend specific activity restrictions during pregnancy for a variety of reasons.  Activity restriction may require that you limit activities such as exercise, lifting, sex, or any other activity that requires great effort.  Discuss the risks and benefits of activity restriction with your health care team to decide if it is best for you and your baby.  Contact your health care provider right away if you think you are having contractions, or if you notice vaginal bleeding, discharge, or cramping. This information is not intended to replace advice given to you by your health care provider. Make sure you discuss any questions you have with your health care provider. Document Released: 09/11/2010 Document Revised: 09/06/2017 Document Reviewed: 09/06/2017 Elsevier Interactive Patient Education  2019 [employer].

## 2018-07-10 ENCOUNTER — Other Ambulatory Visit: Payer: Self-pay

## 2018-07-10 ENCOUNTER — Inpatient Hospital Stay (HOSPITAL_COMMUNITY)
Admission: AD | Admit: 2018-07-10 | Discharge: 2018-07-10 | Disposition: A | Discharge status: Home / Self Care | Payer: Medicaid Other | Attending: Obstetrics | Admitting: Obstetrics

## 2018-07-10 ENCOUNTER — Encounter (HOSPITAL_COMMUNITY): Payer: Self-pay

## 2018-07-10 DIAGNOSIS — Z3A30 30 weeks gestation of pregnancy: Secondary | ICD-10-CM | POA: Diagnosis not present

## 2018-07-10 DIAGNOSIS — O26899 Other specified pregnancy related conditions, unspecified trimester: Secondary | ICD-10-CM

## 2018-07-10 DIAGNOSIS — O26893 Other specified pregnancy related conditions, third trimester: Secondary | ICD-10-CM | POA: Diagnosis not present

## 2018-07-10 DIAGNOSIS — N898 Other specified noninflammatory disorders of vagina: Secondary | ICD-10-CM

## 2018-07-10 DIAGNOSIS — R109 Unspecified abdominal pain: Secondary | ICD-10-CM | POA: Insufficient documentation

## 2018-07-10 LAB — AMNISURE RUPTURE OF MEMBRANE (ROM) NOT AT ARMC: Amnisure ROM: NEGATIVE

## 2018-07-10 MED ORDER — BETAMETHASONE SOD PHOS & ACET 6 (3-3) MG/ML IJ SUSP
12.0000 mg | Freq: Once | INTRAMUSCULAR | Status: AC
  Administered 2018-07-10: 12 mg via INTRAMUSCULAR
  Filled 2018-07-10: qty 2

## 2018-07-10 NOTE — MAU Note (Signed)
Did ok with first shot.  Noted loss of? Mucous plug. (took a picture).  Pt is 3 cm.  Increase in cramping, esp last night.  Not as bad this morning, but very concerned.

## 2018-07-10 NOTE — Discharge Instructions (Signed)
Warning Signs During Pregnancy  A pregnancy lasts about 40 weeks, starting from the first day of your last period until the baby is born. Pregnancy is divided into three phases called trimesters.  · The first trimester refers to week 1 through week 13 of pregnancy.  · The second trimester is the start of week 14 through the end of week 27.  · The third trimester is the start of week 28 until you deliver your baby.  During each trimester of pregnancy, certain signs and symptoms may indicate a problem. Talk with your health care provider about your current health and any medical conditions you have. Make sure you know the symptoms that you should watch for and report.  How does this affect me?    Warning signs in the first trimester  While some changes during the first trimester may be uncomfortable, most do not represent a serious problem. Let your health care provider know if you have any of the following warning signs in the first trimester:  · You cannot eat or drink without vomiting, and this lasts for longer than a day.  · You have vaginal bleeding or spotting along with menstrual-like cramping.  · You have diarrhea for longer than a day.  · You have a fever or other signs of infection, such as:  ? Pain or burning when you urinate.  ? Foul smelling or thick or yellowish vaginal discharge.  Warning signs in the second trimester  As your baby grows and changes during the second trimester, there are additional signs and symptoms that may indicate a problem. These include:  · Signs and symptoms of infection, including a fever.  · Signs or symptoms of a miscarriage or preterm labor, such as regular contractions, menstrual-like cramping, or lower abdominal pain.  · Bloody or watery vaginal discharge or obvious vaginal bleeding.  · Feeling like your heart is pounding.  · Having trouble breathing.  · Nausea, vomiting, or diarrhea that lasts for longer than a day.  · Craving non-food items, such as clay, chalk, or dirt.  This may be a sign of a very treatable medical condition called pica.  Later in your second trimester, watch for signs and symptoms of a serious medical condition called preeclampsia.These include:  · Changes in your vision.  · A severe headache that does not go away.  · Nausea and vomiting.  It is also important to notice if your baby stops moving or moves less than usual during this time.  Warning signs in the third trimester  As you approach the third trimester, your baby is growing and your body is preparing for the birth of your baby. In your third trimester, be sure to let your health care provider know if:  · You have signs and symptoms of infection, including a fever.  · You have vaginal bleeding.  · You notice that your baby is moving less than usual or is not moving.  · You have nausea, vomiting, or diarrhea that lasts for longer than a day.  · You have a severe headache that does not go away.  · You have vision changes, including seeing spots or having blurry or double vision.  · You have increased swelling in your hands or face.  How does this affect my baby?  Throughout your pregnancy, always report any of the warning signs of a problem to your health care provider. This can help prevent complications that may affect your baby, including:  · Increased risk   Any of the following apply to you during any trimester of pregnancy: ? You have strong emotions, such as sadness or anxiety, that interfere with work or personal relationships. ? You feel unsafe in your home and need help finding a safe place to live. ? You are using tobacco products, alcohol, or drugs and you need help to stop. Get help right away if: You have signs or symptoms of labor before 37 weeks of  pregnancy. These include:  Contractions that are 5 minutes or less apart, or that increase in frequency, intensity, or length.  Sudden, sharp abdominal pain or low back pain.  Uncontrolled gush or trickle of fluid from your vagina. Summary  A pregnancy lasts about 40 weeks, starting from the first day of your last period until the baby is born. Pregnancy is divided into three phases called trimesters. Each trimester has warning signs to watch for.  Always report any warning signs to your health care provider in order to prevent complications that may affect both you and your baby.  Talk with your health care provider about your current health and any medical conditions you have. Make sure you know the symptoms that you should watch for and report. This information is not intended to replace advice given to you by your health care provider. Make sure you discuss any questions you have with your health care provider. Document Released: 03/03/2017 Document Revised: 03/03/2017 Document Reviewed: 03/03/2017 Elsevier Interactive Patient Education  2019 [employer].     Fetal Movement Counts Patient Name: ________________________________________________ Patient Due Date: ____________________ What is a fetal movement count?  A fetal movement count is the number of times that you feel your baby move during a certain amount of time. This may also be called a fetal kick count. A fetal movement count is recommended for every pregnant woman. You may be asked to start counting fetal movements as early as week 28 of your pregnancy. Pay attention to when your baby is most active. You may notice your baby's sleep and wake cycles. You may also notice things that make your baby move more. You should do a fetal movement count:  When your baby is normally most active.  At the same time each day. A good time to count movements is while you are resting, after having something to eat and drink. How do I  count fetal movements? 1. Find a quiet, comfortable area. Sit, or lie down on your side. 2. Write down the date, the start time and stop time, and the number of movements that you felt between those two times. Take this information with you to your health care visits. 3. For 2 hours, count kicks, flutters, swishes, rolls, and jabs. You should feel at least 10 movements during 2 hours. 4. You may stop counting after you have felt 10 movements. 5. If you do not feel 10 movements in 2 hours, have something to eat and drink. Then, keep resting and counting for 1 hour. If you feel at least 4 movements during that hour, you may stop counting. Contact a health care provider if:  You feel fewer than 4 movements in 2 hours.  Your baby is not moving like he or she usually does. Date: ____________ Start time: ____________ Stop time: ____________ Movements: ____________ Date: ____________ Start time: ____________ Stop time: ____________ Movements: ____________ Date: ____________ Start time: ____________ Stop time: ____________ Movements: ____________ Date: ____________ Start time: ____________ Stop time: ____________ Movements: ____________ Date: ____________ Start time: ____________ Stop time:  ____________ Movements: ____________ Date: ____________ Start time: ____________ Stop time: ____________ Movements: ____________ Date: ____________ Start time: ____________ Stop time: ____________ Movements: ____________ Date: ____________ Start time: ____________ Stop time: ____________ Movements: ____________ Date: ____________ Start time: ____________ Stop time: ____________ Movements: ____________ This information is not intended to replace advice given to you by your health care provider. Make sure you discuss any questions you have with your health care provider. Document Released: 06/16/2006 Document Revised: 01/14/2016 Document Reviewed: 06/26/2015 Elsevier Interactive Patient Education  2019 [employer].

## 2018-07-10 NOTE — MAU Provider Note (Signed)
Chief Complaint:  Abdominal Pain and 2nd betamethasone   First Provider Initiated Contact with Patient 07/10/18 0907     HPI: Jillian Mercer is a 33 y.o. G4P3003 at [redacted]w[redacted]d who presents to maternity admissions for her 2nd dose of BMZ. Was seen in MAU yesterday & given BMZ d/t preterm dilation. Had negative FFN. States she had a lot of cramping last night and is worried, would like to be rechecked. Denies pain today. Has had some thin discharge since cervix being checked yesterday & wondering if that's from the gel used for exam. Denies vaginal bleeding. No recent intercourse. Denies hx of PTL in previous pregnancy. Normal fetal movement.    Past Medical History:  Diagnosis Date  . Vertigo    OB History  Gravida Para Term Preterm AB Living  4 3 3     3   SAB TAB Ectopic Multiple Live Births               # Outcome Date GA Lbr Len/2nd Weight Sex Delivery Anes PTL Lv  4 Current           3 Term      Vag-Spont     2 Term      Vag-Spont     1 Term      CS-LTranv      Past Surgical History:  Procedure Laterality Date  . CESAREAN SECTION  2007   Family History  Problem Relation Age of Onset  . Lupus Mother   . Skin cancer Maternal Grandmother   . Hypertension Maternal Grandmother    Social History   Tobacco Use  . Smoking status: Never Smoker  . Smokeless tobacco: Never Used  Substance Use Topics  . Alcohol use: No  . Drug use: No   Allergies  Allergen Reactions  . Penicillins    No medications prior to admission.    I have reviewed patient's Past Medical Hx, Surgical Hx, Family Hx, Social Hx, medications and allergies.   ROS:  Review of Systems  Constitutional: Negative.   Gastrointestinal: Negative.   Genitourinary: Positive for vaginal discharge. Negative for dysuria and vaginal bleeding.    Physical Exam   Patient Vitals for the past 24 hrs:  BP Temp Temp src Pulse Resp SpO2  07/10/18 0856 120/73 98.7 F (37.1 C) Oral 76 16 99 %    Constitutional:  Well-developed, well-nourished female in no acute distress.  Cardiovascular: normal rate & rhythm, no murmur Respiratory: normal effort, lung sounds clear throughout GI: Abd soft, non-tender, gravid appropriate for gestational age. Pos BS x 4 MS: Extremities nontender, no edema, normal ROM Neurologic: Alert and oriented x 4.  GU:   Cervix 2.5/thick/posterior/ballotable. No blood. No pooling.   NST:  Baseline: 135 bpm, Variability: Good {> 6 bpm), Accelerations: Non-reactive but appropriate for gestational age and Decelerations: Absent   Labs: Results for orders placed or performed during the hospital encounter of 07/10/18 (from the past 24 hour(s))  Amnisure rupture of membrane (rom)not at [facility]     Status: None   Collection Time: 07/10/18  9:26 AM  Result Value Ref Range   Amnisure ROM NEGATIVE     Imaging:  No results found.  MAU Course: Orders Placed This Encounter  Procedures  . Amnisure rupture of membrane (rom)not at [facility]  . Discharge patient   Meds ordered this encounter  Medications  . betamethasone acetate-betamethasone sodium phosphate (CELESTONE) injection 12 mg    MDM: Pt given her 2nd dose of BMZ.  Cervix 2.5/thick/posterior. Pt had negative FFN yesterday & has no symptoms of labor today.  No evidence of PPROM & amnisure negative Pt reassured. Has f/u in office later this week.   Assessment: 1. Abdominal cramping affecting pregnancy   2. [redacted] weeks gestation of pregnancy   3. Vaginal discharge during pregnancy in third trimester     Plan: Discharge home in stable condition.  Preterm Labor precautions and fetal kick counts Follow-up Information    Ob/Gyn, [facility] Follow up.   Contact information: [place] [address] [phone]           Allergies as of 07/10/2018      Reactions   Penicillins       Medication List    STOP taking these medications   albuterol 108 (90 Base) MCG/ACT inhaler Commonly known as:   PROVENTIL HFA;VENTOLIN HFA     TAKE these medications   famotidine 10 MG tablet Commonly known as:  PEPCID Take 10 mg by mouth 2 (two) times daily.   meclizine 25 MG tablet Commonly known as:  ANTIVERT Take 25 mg by mouth 3 (three) times daily as needed for dizziness.   metroNIDAZOLE 500 MG tablet Commonly known as:  FLAGYL Take 1 tablet (500 mg total) by mouth 2 (two) times daily.   prenatal multivitamin Tabs tablet Take 1 tablet by mouth daily at 12 noon.   promethazine 25 MG tablet Commonly known as:  PHENERGAN Take 25 mg by mouth every 6 (six) hours as needed for nausea or vomiting.       Jorje Guild, NP 07/10/2018 2:20 PM

## 2018-07-27 IMAGING — DX DG CHEST 2V
2 series · 2 of 2 positions shown · non-contrast
Comparison: None.

CLINICAL DATA: Shortness of breath with cough and wheezing

EXAM:
CHEST  2 VIEW

[chest pa]
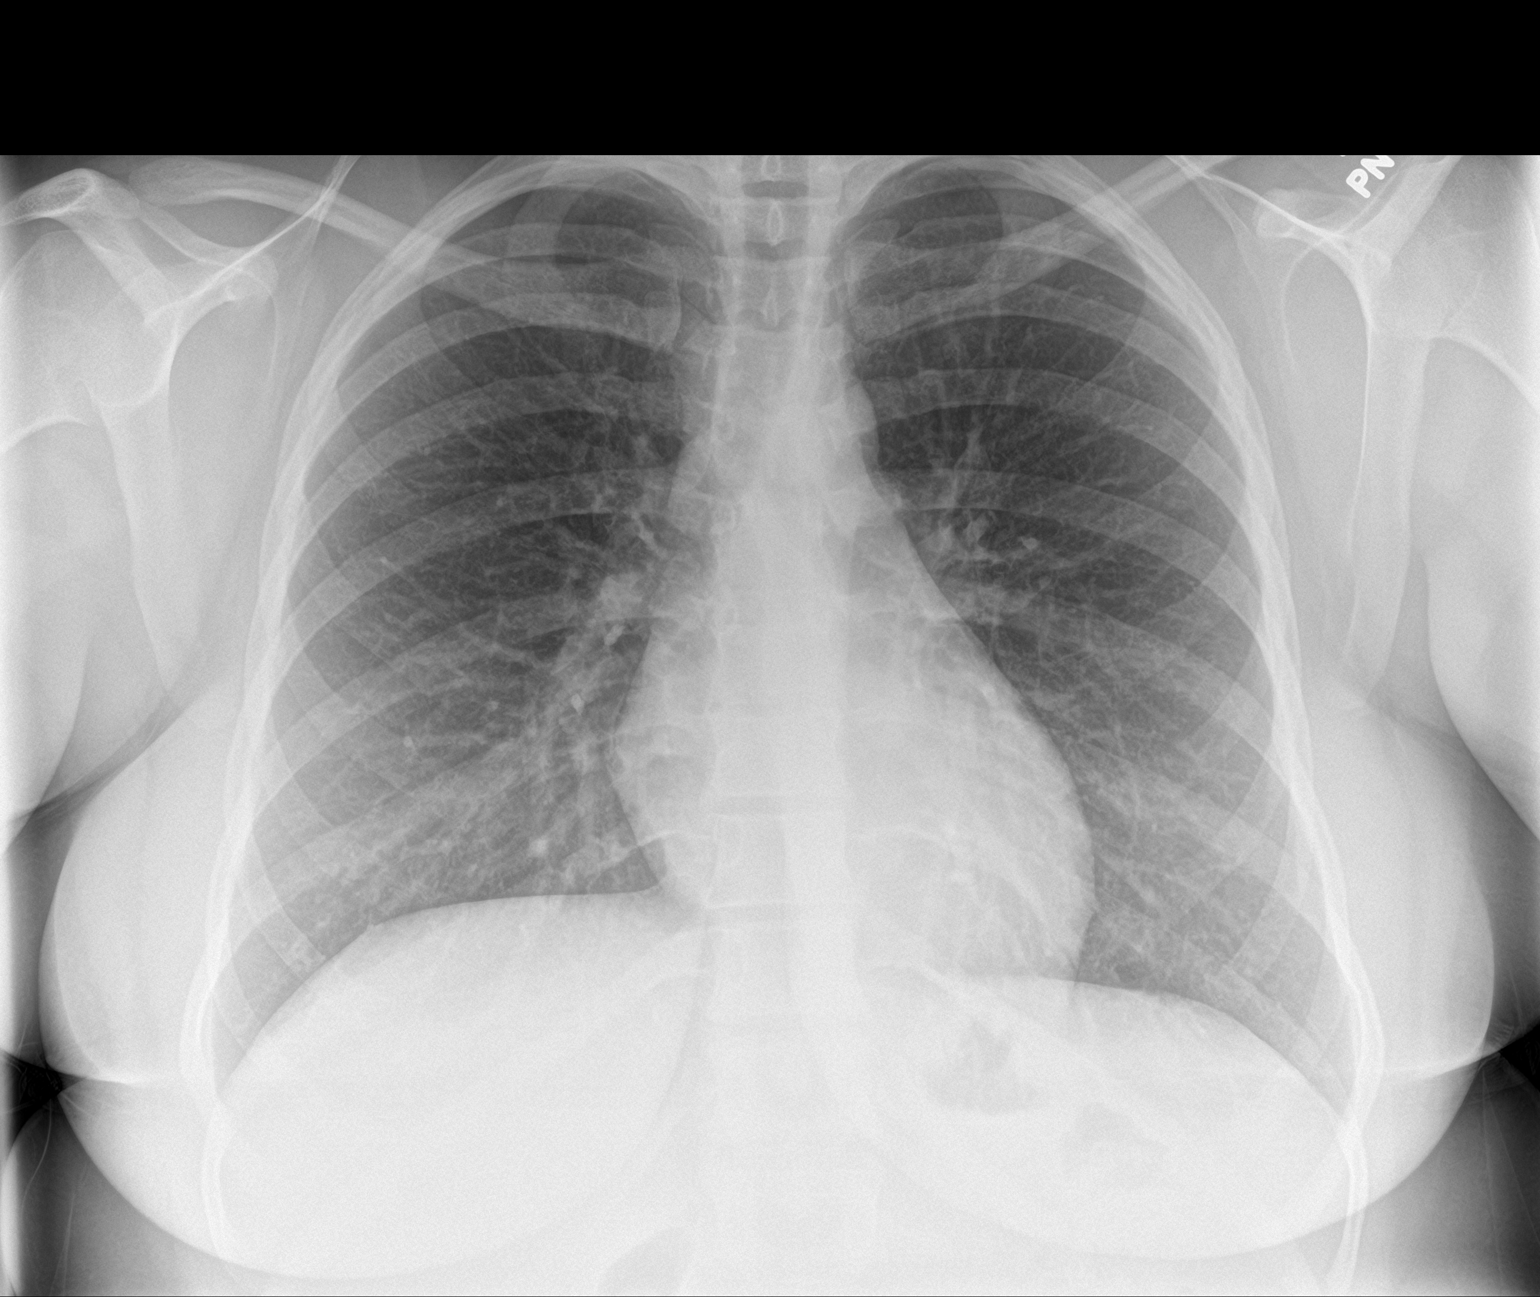

[chest lat]
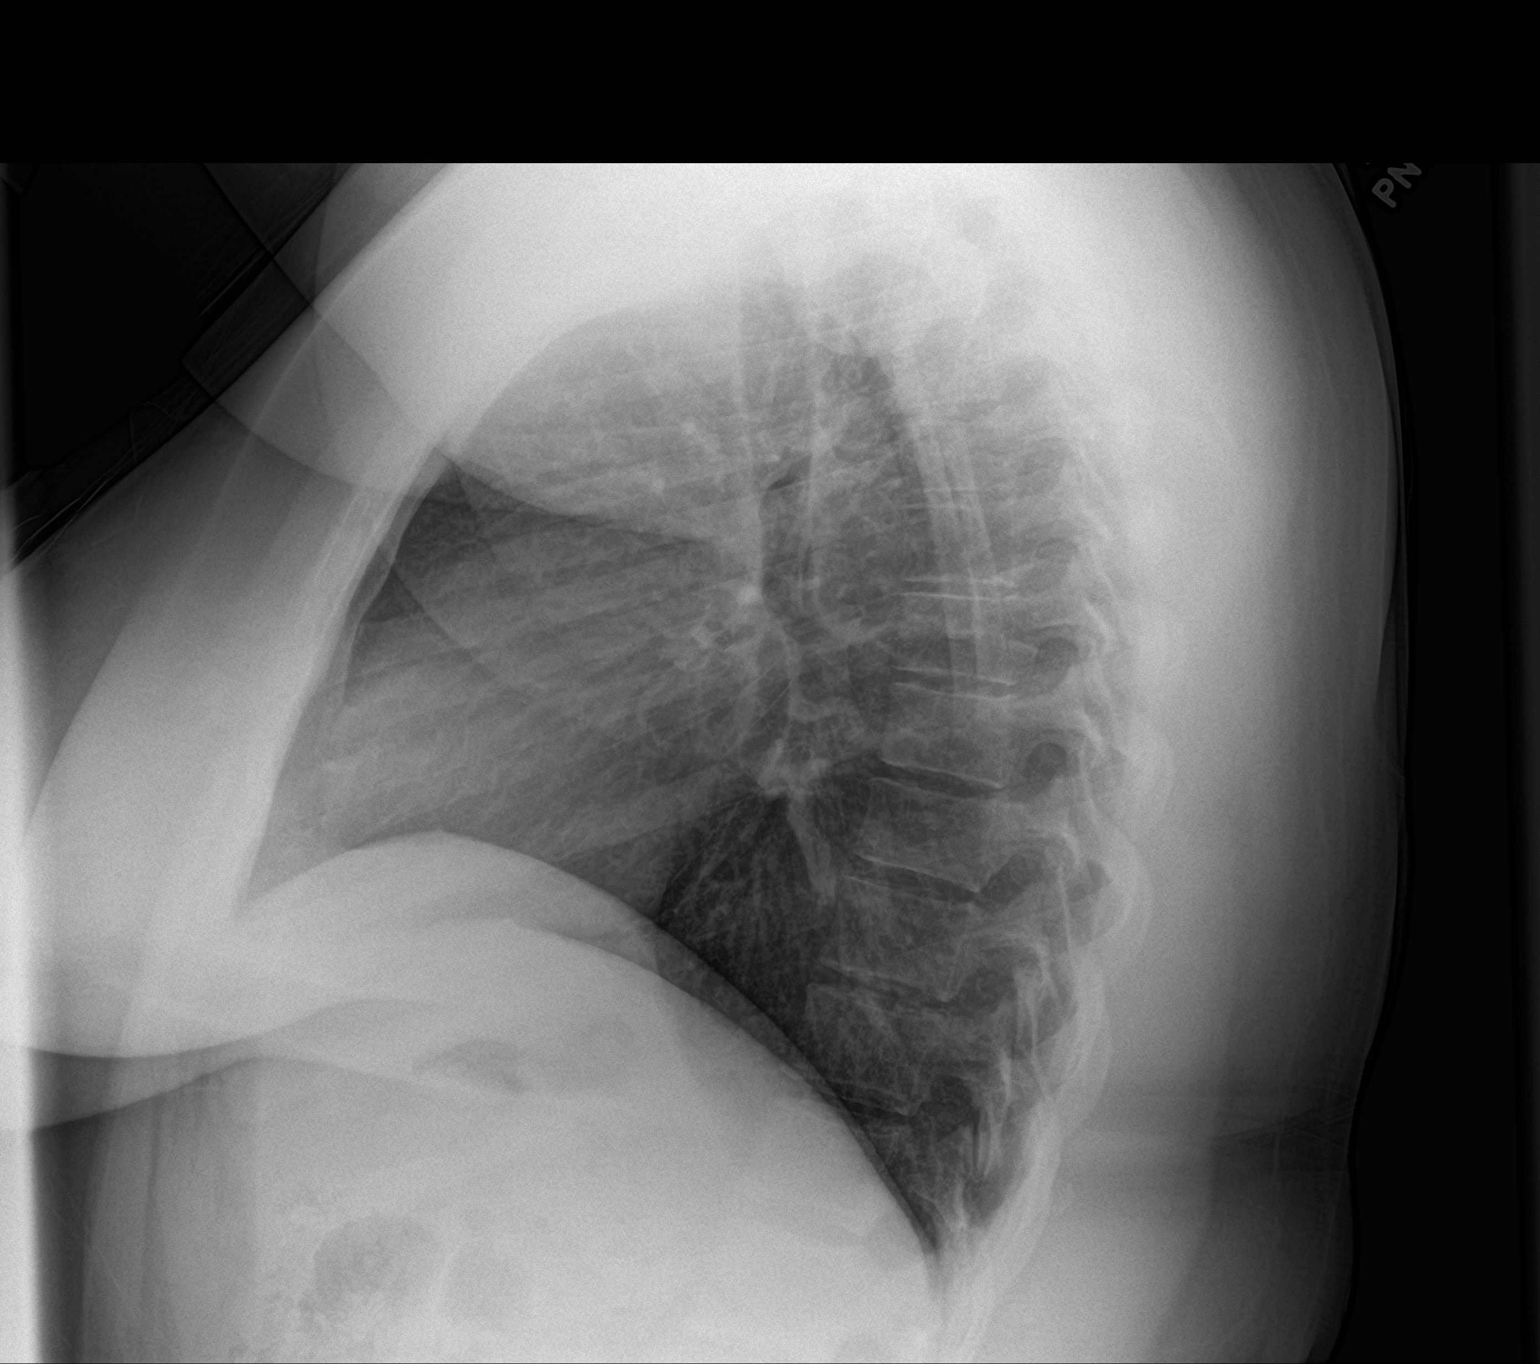

[2 of 2 positions shown; findings below may reference images not displayed]

FINDINGS: Lungs are clear. Heart size and pulmonary vascularity are normal. No
adenopathy. No bone lesions.
IMPRESSION: No edema or consolidation.

## 2018-07-28 ENCOUNTER — Inpatient Hospital Stay (HOSPITAL_COMMUNITY)
Admission: AD | Admit: 2018-07-28 | Discharge: 2018-08-05 | DRG: 807 | Disposition: A | Discharge status: Home / Self Care | Payer: Medicaid Other | Attending: Obstetrics and Gynecology | Admitting: Obstetrics and Gynecology

## 2018-07-28 ENCOUNTER — Other Ambulatory Visit: Payer: Self-pay

## 2018-07-28 ENCOUNTER — Encounter (HOSPITAL_COMMUNITY): Payer: Self-pay | Admitting: *Deleted

## 2018-07-28 DIAGNOSIS — Z880 Allergy status to penicillin: Secondary | ICD-10-CM

## 2018-07-28 DIAGNOSIS — Z3A33 33 weeks gestation of pregnancy: Secondary | ICD-10-CM | POA: Diagnosis not present

## 2018-07-28 DIAGNOSIS — O42913 Preterm premature rupture of membranes, unspecified as to length of time between rupture and onset of labor, third trimester: Principal | ICD-10-CM | POA: Diagnosis present

## 2018-07-28 DIAGNOSIS — R12 Heartburn: Secondary | ICD-10-CM | POA: Diagnosis present

## 2018-07-28 DIAGNOSIS — O42919 Preterm premature rupture of membranes, unspecified as to length of time between rupture and onset of labor, unspecified trimester: Secondary | ICD-10-CM | POA: Diagnosis present

## 2018-07-28 DIAGNOSIS — O99214 Obesity complicating childbirth: Secondary | ICD-10-CM | POA: Diagnosis present

## 2018-07-28 DIAGNOSIS — O9962 Diseases of the digestive system complicating childbirth: Secondary | ICD-10-CM | POA: Diagnosis present

## 2018-07-28 DIAGNOSIS — O9902 Anemia complicating childbirth: Secondary | ICD-10-CM | POA: Diagnosis present

## 2018-07-28 DIAGNOSIS — D649 Anemia, unspecified: Secondary | ICD-10-CM | POA: Diagnosis present

## 2018-07-28 DIAGNOSIS — O34219 Maternal care for unspecified type scar from previous cesarean delivery: Secondary | ICD-10-CM | POA: Diagnosis present

## 2018-07-28 DIAGNOSIS — Z3490 Encounter for supervision of normal pregnancy, unspecified, unspecified trimester: Secondary | ICD-10-CM

## 2018-07-28 HISTORY — DX: Preterm premature rupture of membranes, unspecified as to length of time between rupture and onset of labor, unspecified trimester: O42.919

## 2018-07-28 LAB — CBC
HCT: 33 % — ABNORMAL LOW (ref 36.0–46.0)
Hemoglobin: 10.3 g/dL — ABNORMAL LOW (ref 12.0–15.0)
MCH: 25.6 pg — ABNORMAL LOW (ref 26.0–34.0)
MCHC: 31.2 g/dL (ref 30.0–36.0)
MCV: 82.1 fL (ref 80.0–100.0)
Platelets: 315 10*3/uL (ref 150–400)
RBC: 4.02 MIL/uL (ref 3.87–5.11)
RDW: 15.6 % — ABNORMAL HIGH (ref 11.5–15.5)
WBC: 10.6 10*3/uL — AB (ref 4.0–10.5)
nRBC: 0 % (ref 0.0–0.2)

## 2018-07-28 LAB — URINALYSIS, ROUTINE W REFLEX MICROSCOPIC
Bilirubin Urine: NEGATIVE
Glucose, UA: NEGATIVE mg/dL
Hgb urine dipstick: NEGATIVE
Ketones, ur: NEGATIVE mg/dL
Leukocytes,Ua: NEGATIVE
Nitrite: NEGATIVE
Protein, ur: NEGATIVE mg/dL
SPECIFIC GRAVITY, URINE: 1.008 (ref 1.005–1.030)
pH: 6 (ref 5.0–8.0)

## 2018-07-28 LAB — WET PREP, GENITAL
Clue Cells Wet Prep HPF POC: NONE SEEN
Sperm: NONE SEEN
Trich, Wet Prep: NONE SEEN
Yeast Wet Prep HPF POC: NONE SEEN

## 2018-07-28 LAB — POCT FERN TEST: POCT Fern Test: POSITIVE

## 2018-07-28 MED ORDER — BETAMETHASONE SOD PHOS & ACET 6 (3-3) MG/ML IJ SUSP
12.0000 mg | INTRAMUSCULAR | Status: AC
  Administered 2018-07-28 – 2018-07-29 (×2): 12 mg via INTRAMUSCULAR
  Filled 2018-07-28 (×2): qty 2

## 2018-07-28 MED ORDER — AZITHROMYCIN 250 MG PO TABS
1000.0000 mg | ORAL_TABLET | Freq: Once | ORAL | Status: AC
  Administered 2018-07-28: 1000 mg via ORAL
  Filled 2018-07-28: qty 4

## 2018-07-28 MED ORDER — ACETAMINOPHEN 325 MG PO TABS: 650.0000 mg | ORAL_TABLET | ORAL | Status: DC | PRN

## 2018-07-28 MED ORDER — CEPHALEXIN 500 MG PO CAPS
500.0000 mg | ORAL_CAPSULE | Freq: Four times a day (QID) | ORAL | Status: DC
  Filled 2018-07-28 (×2): qty 1

## 2018-07-28 MED ORDER — DOCUSATE SODIUM 100 MG PO CAPS
100.0000 mg | ORAL_CAPSULE | Freq: Every day | ORAL | Status: DC
  Administered 2018-07-28 – 2018-08-01 (×5): 100 mg via ORAL
  Filled 2018-07-28 (×5): qty 1

## 2018-07-28 MED ORDER — CEPHALEXIN 500 MG PO CAPS
500.0000 mg | ORAL_CAPSULE | Freq: Four times a day (QID) | ORAL | Status: DC
  Administered 2018-07-30 – 2018-08-02 (×12): 500 mg via ORAL
  Filled 2018-07-28 (×15): qty 1

## 2018-07-28 MED ORDER — ZOLPIDEM TARTRATE 5 MG PO TABS: 5.0000 mg | ORAL_TABLET | Freq: Every evening | ORAL | Status: DC | PRN

## 2018-07-28 MED ORDER — CALCIUM CARBONATE ANTACID 500 MG PO CHEW: 2.0000 | CHEWABLE_TABLET | ORAL | Status: DC | PRN

## 2018-07-28 MED ORDER — CEFAZOLIN SODIUM-DEXTROSE 1-4 GM/50ML-% IV SOLN
1.0000 g | Freq: Three times a day (TID) | INTRAVENOUS | Status: AC
  Administered 2018-07-28 – 2018-07-30 (×6): 1 g via INTRAVENOUS
  Filled 2018-07-28 (×6): qty 50

## 2018-07-28 MED ORDER — PRENATAL MULTIVITAMIN CH
1.0000 | ORAL_TABLET | Freq: Every day | ORAL | Status: DC
  Administered 2018-07-29 – 2018-08-01 (×4): 1 via ORAL
  Filled 2018-07-28 (×5): qty 1

## 2018-07-28 NOTE — Progress Notes (Signed)
Pharmacy Antibiotic Note  Alaiyah Bollman is a 33 y.o. female admitted on 07/28/2018 with Abdominal Pain, rupture of membranes.  Pharmacy has been consulted for cefazolin dosing for latency.  Plan: Cefazolin 1gm IV q8hr x 48 hrs followed by cephalexin 500mg  po q6hr x 5 days.  Height: 5\' 5"  (165.1 cm) Weight: 261 lb 8 oz (118.6 kg) IBW/kg (Calculated) : 57  Temp (24hrs), Avg:98.4 F (36.9 C), Min:98.4 F (36.9 C), Max:98.4 F (36.9 C)  No results for input(s): WBC, CREATININE, LATICACIDVEN, VANCOTROUGH, VANCOPEAK, VANCORANDOM, GENTTROUGH, GENTPEAK, GENTRANDOM, TOBRATROUGH, TOBRAPEAK, TOBRARND, AMIKACINPEAK, AMIKACINTROU, AMIKACIN in the last 168 hours.  CrCl cannot be calculated (No successful lab value found.).    Allergies  Allergen Reactions  . Penicillins Rash    Antimicrobials this admission: Azithromycin 1000 mg PO once   Thank you for allowing pharmacy to be a part of this patient's care.  Wyline Mood 07/28/2018 1:55 PM

## 2018-07-28 NOTE — H&P (Signed)
33 y.o. [redacted]w[redacted]d  G4P3003 comes in c/o LOF.  Has been seen in MAU within the past 2 weeks for r.o PTL and LOF.  Prior amnisure and FFN negative.  Today +pool and +ferns.  Pt denies contractions, reports good FM and no bleeding.  Had late [facility] at 17 weeks.  Past Medical History:  Diagnosis Date  . Vertigo     Past Surgical History:  Procedure Laterality Date  . CESAREAN SECTION  2007    OB History  Gravida Para Term Preterm AB Living  4 3 3     3   SAB TAB Ectopic Multiple Live Births               # Outcome Date GA Lbr Len/2nd Weight Sex Delivery Anes PTL Lv  4 Current           3 Term      Vag-Spont     2 Term      Vag-Spont     1 Term      CS-LTranv       Social History   Socioeconomic History  . Marital status: Single    Spouse name: Not on file  . Number of children: Not on file  . Years of education: Not on file  . Highest education level: Not on file  Occupational History  . Not on file  Social Needs  . Financial resource strain: Not on file  . Food insecurity:    Worry: Not on file    Inability: Not on file  . Transportation needs:    Medical: Not on file    Non-medical: Not on file  Tobacco Use  . Smoking status: Never Smoker  . Smokeless tobacco: Never Used  Substance and Sexual Activity  . Alcohol use: No  . Drug use: No  . Sexual activity: Yes  Lifestyle  . Physical activity:    Days per week: Not on file    Minutes per session: Not on file  . Stress: Not on file  Relationships  . Social connections:    Talks on phone: Not on file    Gets together: Not on file    Attends religious service: Not on file    Active member of club or organization: Not on file    Attends meetings of clubs or organizations: Not on file    Relationship status: Not on file  . Intimate partner violence:    Fear of current or ex partner: Not on file    Emotionally abused: Not on file    Physically abused: Not on file    Forced sexual activity: Not on file  Other Topics  Concern  . Not on file  Social History Narrative   Single   Lives with fiance and her children    3 children   OCCUPATION: stay at home mom   TRAVEL: [place] Nov 2017 for 1 week   Penicillins    Prenatal Transfer Tool  Maternal Diabetes: No Genetic Screening: Normal Maternal Ultrasounds/Referrals: Normal Fetal Ultrasounds or other Referrals:  None Maternal Substance Abuse:  No Significant Maternal Medications:  None Significant Maternal Lab Results: Lab values include: Other: GBS collected at [place], result pending  Other PNC: morbid obesity, h.o cesarean section f/b VBAC x2    Vitals:   07/28/18 1105 07/28/18 1300  BP: 123/75 132/73  Pulse: 70 83  Resp: 18 18  Temp: 98.4 F (36.9 C) 98.7 F (37.1 C)  TempSrc: Oral Oral  SpO2: 100%  100%  Weight: 118.6 kg   Height: 5\' 5"  (1.651 m)     Lungs/Cor:  NAD Abdomen:  soft, gravid Ex:  no cords, erythema SVE:  Deferred, but not visible open FHTs:  140, good STV, NST R; Cat 1 tracing. Toco:  occ.   A/P   Admitted with PPROM 33.1  GBS pending  +fern  PCN allergy as child, pt unsure but states she thinks she had hives, doesn't know if she has had any PCN or cephalosporin since.  Will fo Azith 1000mg  po x 1 and Ancef 1g q8 x 48 hrs followed by Keflex 500mg  po q6hr x5d SCDs Continuous fetal monitoring Repeat course of betamethasone, RN could not find that it was administered, pt states she believes she got steriods 2-3 weeks ago.  Other routine antenatal care  Allyn Kenner

## 2018-07-28 NOTE — MAU Note (Signed)
Started leaking around 0830, clear fluid, just keeps leaking. No bleeding, little cramping.  Pt is on pelvic rest

## 2018-07-28 NOTE — MAU Provider Note (Signed)
History     CSN: [id number]  Arrival date and time: 07/28/18 1047   First Provider Initiated Contact with Patient 07/28/18 1142      Chief Complaint  Patient presents with  . Abdominal Pain  . Rupture of Membranes   Jillian Mercer is a 33 y.o. G4P3003 at [redacted]w[redacted]d who presents for Abdominal Pain and evaluation for Rupture of Membranes.  She states she has been steadily leaking since 0800.  She reports that she has been saturating through pads and the fluid is clear and odorless.  She reports some mild cramping since the starting of fluid leakage, but denies contractions and vaginal bleeding.  She endorses fetal movement and states she has been overall complaint with her bed rest regime, though her husband reports she was "doing more than usual" yesterday.        OB History    Gravida  4   Para  3   Term  3   Preterm      AB      Living  3     SAB      TAB      Ectopic      Multiple      Live Births              Past Medical History:  Diagnosis Date  . Vertigo     Past Surgical History:  Procedure Laterality Date  . CESAREAN SECTION  2007    Family History  Problem Relation Age of Onset  . Lupus Mother   . Skin cancer Maternal Grandmother   . Hypertension Maternal Grandmother     Social History   Tobacco Use  . Smoking status: Never Smoker  . Smokeless tobacco: Never Used  Substance Use Topics  . Alcohol use: No  . Drug use: No    Allergies:  Allergies  Allergen Reactions  . Penicillins     Medications Prior to Admission  Medication Sig Dispense Refill Last Dose  . famotidine (PEPCID) 10 MG tablet Take 10 mg by mouth 2 (two) times daily.   07/08/2018 at Unknown time  . meclizine (ANTIVERT) 25 MG tablet Take 25 mg by mouth 3 (three) times daily as needed for dizziness.   More than a month at Unknown time  . metroNIDAZOLE (FLAGYL) 500 MG tablet Take 1 tablet (500 mg total) by mouth 2 (two) times daily. 14 tablet 0   . Prenatal Vit-Fe  Fumarate-FA (PRENATAL MULTIVITAMIN) TABS tablet Take 1 tablet by mouth daily at 12 noon.   07/08/2018 at Unknown time  . promethazine (PHENERGAN) 25 MG tablet Take 25 mg by mouth every 6 (six) hours as needed for nausea or vomiting.   Past Week at Unknown time    Review of Systems  Constitutional: Negative for chills and fever.  Respiratory: Negative for shortness of breath.   Gastrointestinal: Negative for abdominal pain, diarrhea, nausea and vomiting.  Genitourinary: Negative for dysuria, vaginal bleeding and vaginal discharge.  Neurological: Negative for dizziness, light-headedness and headaches.   Physical Exam   Blood pressure 123/75, pulse 70, temperature 98.4 F (36.9 C), temperature source Oral, resp. rate 18, height 5\' 5"  (1.651 m), weight 118.6 kg, last menstrual period 12/08/2017, SpO2 100 %.  Physical Exam  Constitutional: She is oriented to person, place, and time. She appears well-developed and well-nourished.  HENT:  Head: Normocephalic and atraumatic.  Eyes: Conjunctivae are normal.  Neck: Normal range of motion.  Cardiovascular: Normal rate, regular rhythm and normal heart  sounds.  Respiratory: Effort normal and breath sounds normal.  GI: Soft. Bowel sounds are normal. There is no abdominal tenderness.  Genitourinary:    Genitourinary Comments: Sterile Speculum Exam: -Vaginal Vault: Pink mucosa.  Moderate amt clear fluid in vault, +Pooling, -wet prep collected -Cervix:Pink, no lesions, cysts, or polyps.  Appears open. No active bleeding, but clear watery mucoid discharge from os. -Valsalva maneuver -Bimanual Exam: Deferred   Musculoskeletal: Normal range of motion.  Neurological: She is alert and oriented to person, place, and time.  Skin: Skin is warm and dry.  Psychiatric: She has a normal mood and affect. Her behavior is normal.    Fetal Assessment 135 bpm, Mod Var, -Decels, +Accels Toco: Mild Irritability with occasional ctx Noted  MAU Course   Results for  orders placed or performed during the hospital encounter of 07/28/18 (from the past 24 hour(s))  Urinalysis, Routine w reflex microscopic     Status: Abnormal   Collection Time: 07/28/18 11:34 AM  Result Value Ref Range   Color, Urine STRAW (A) YELLOW   APPearance CLEAR CLEAR   Specific Gravity, Urine 1.008 1.005 - 1.030   pH 6.0 5.0 - 8.0   Glucose, UA NEGATIVE NEGATIVE mg/dL   Hgb urine dipstick NEGATIVE NEGATIVE   Bilirubin Urine NEGATIVE NEGATIVE   Ketones, ur NEGATIVE NEGATIVE mg/dL   Protein, ur NEGATIVE NEGATIVE mg/dL   Nitrite NEGATIVE NEGATIVE   Leukocytes,Ua NEGATIVE NEGATIVE  Wet prep, genital     Status: Abnormal   Collection Time: 07/28/18 12:53 PM  Result Value Ref Range   Yeast Wet Prep HPF POC NONE SEEN NONE SEEN   Trich, Wet Prep NONE SEEN NONE SEEN   Clue Cells Wet Prep HPF POC NONE SEEN NONE SEEN   WBC, Wet Prep HPF POC MODERATE (A) NONE SEEN   Sperm NONE SEEN   CBC on admission     Status: Abnormal   Collection Time: 07/28/18  1:43 PM  Result Value Ref Range   WBC 10.6 (H) 4.0 - 10.5 K/uL   RBC 4.02 3.87 - 5.11 MIL/uL   Hemoglobin 10.3 (L) 12.0 - 15.0 g/dL   HCT 33.0 (L) 36.0 - 46.0 %   MCV 82.1 80.0 - 100.0 fL   MCH 25.6 (L) 26.0 - 34.0 pg   MCHC 31.2 30.0 - 36.0 g/dL   RDW 15.6 (H) 11.5 - 15.5 %   Platelets 315 150 - 400 K/uL   nRBC 0.0 0.0 - 0.2 %    MDM PE Labs:Fern, Amnisure Collected   Assessment and Plan  33 year old G4P3003 at 33.1 weeks Vaginal Leakage  -Nurse reports collection of Fern from perineum. Maryann Alar Negative -Results reviewed with patient and pelvic exam performed; BME Deferred. -Additional fern collected with wet prep -Will review and discuss results   Follow Up (12:41 PM) pPROM  -Fern Negative -Amnisure collected by blind swab with moderate amt of fluid noted upon removal of swab and patient position change. -Fern collected from perineum and reviewed-Positive -Patient informed of results. -Informed of need to contact  MD on call for admission orders. -Patient requests ability to eat, informed okay.    Follow Up (1:33 PM)  -Dr. Rogue Bussing contacted and is agreeable to admission.   *Requests regular antenatal admission orders with modifications: *Latency Antibiotics with consideration of PCN Allergy; +Azithromax 1 gram PO once +Ancef 1 gram IV Q 8hrs x 48 hours +Keflex 500mg  PO QID x 5 days *Continuous EFM *Repeat BMZ *Regular Diet *SCDs -BS [place] confirms  cephalic presentation. -Patient and husband questions and concerns addressed. -Admission orders placed   Maryann Conners MSN, CNM 07/28/2018, 11:42 AM

## 2018-07-28 NOTE — MAU Note (Signed)
Urine sent to lab 

## 2018-07-28 NOTE — Plan of Care (Signed)
  Problem: Education: Goal: Knowledge of disease or condition will improve Outcome: Progressing   

## 2018-07-29 MED ORDER — FAMOTIDINE 20 MG PO TABS
20.0000 mg | ORAL_TABLET | Freq: Two times a day (BID) | ORAL | Status: DC | PRN
  Administered 2018-07-29 – 2018-08-01 (×5): 20 mg via ORAL
  Filled 2018-07-29 (×5): qty 1

## 2018-07-29 NOTE — Consult Note (Signed)
Neonatology Consult  Note:  At the request of the patients obstetrician Dr. Rogue Bussing I met with Jillian Mercer who is at 16 2 weeks currently with preg complicated by PPROM.  She is receiving betamethasone and latency antibiotics.  We reviewed initial delivery room management, including CPAP, Benson, and low but certainly possible need for intubation for surfactant administration.  We discussed feeding immaturity and need for full po intake with multiple days of good weight gain and no apnea or bradycardia before discharge.  We reviewed increased risk of jaundice, infection, and temperature instability.   Discussed likely length of stay.  Thank you for allowing [place] to participate in her care.  Please call with questions.  Higinio Roger, DO  Neonatologist  The total length of face-to-face or floor / unit time for this encounter was 20 minutes.  Counseling and / or coordination of care was greater than fifty percent of the time.

## 2018-07-29 NOTE — Progress Notes (Signed)
Patient is comfortable, denies and contractions or bleeding.  Reports good fetal movement, denies abd tenderness, fever or calf tenderness.  Vitals:   07/28/18 1900 07/28/18 2328 07/29/18 0622 07/29/18 0838  BP: 114/74 129/70 (!) 105/43 114/72  Pulse: 93 86 81 85  Resp: 18 18 18 18   Temp: 98.7 F (37.1 C) 98.7 F (37.1 C) 98.5 F (36.9 C) 98 F (36.7 C)  TempSrc: Oral Oral Oral Oral  SpO2: 99% 100% 98% 100%  Weight:      Height:       Abd: gravid NT Ext: no calf tenderness FHT: 130, reactive and Cat 1 (2 min decel with good recovery approx 09:40 this am) TOCO: quiet SVE: deferred  Lab Results  Component Value Date   WBC 10.6 (H) 07/28/2018   HGB 10.3 (L) 07/28/2018   HCT 33.0 (L) 07/28/2018   MCV 82.1 07/28/2018   PLT 315 07/28/2018    --/--/A NEG (02/28 1612)  A/P HD #2 PPROM 33.2 S/p beta x 1 will received 2nd dose today Cont latency antibiotic regimen SCDs Continuous fetal monitoring Cont other antenatal care. NICU consult   Jillian Mercer

## 2018-07-30 LAB — CULTURE, BETA STREP (GROUP B ONLY)

## 2018-07-30 MED ORDER — SODIUM CHLORIDE 0.9% FLUSH
3.0000 mL | Freq: Two times a day (BID) | INTRAVENOUS | Status: DC
  Administered 2018-07-30 – 2018-08-01 (×5): 3 mL via INTRAVENOUS

## 2018-07-30 NOTE — Plan of Care (Signed)
  Problem: Education: Goal: Knowledge of disease or condition will improve Outcome: Progressing   

## 2018-07-30 NOTE — Plan of Care (Signed)
  Problem: Health Behavior/Discharge Planning: Goal: Ability to manage health-related needs will improve Outcome: Progressing   Problem: Clinical Measurements: Goal: Ability to maintain clinical measurements within normal limits will improve Outcome: Progressing   Problem: Clinical Measurements: Goal: Will remain free from infection Outcome: Progressing   Problem: Clinical Measurements: Goal: Diagnostic test results will improve Outcome: Progressing   

## 2018-07-30 NOTE — Progress Notes (Signed)
Patient is comfortable, denies and fever, abd tenderness, bleeding or contractions. Was sleeping when I arrived so couldn't comment on current FM.  No other complaints.  Vitals:   07/29/18 1625 07/29/18 1917 07/29/18 2248 07/30/18 0616  BP: 132/60 114/70 (!) 111/56 (!) 125/58  Pulse: 75 67 66 66  Resp: 18 18 18 17   Temp: 98.7 F (37.1 C) 97.7 F (36.5 C) 97.8 F (36.6 C) 98.8 F (37.1 C)  TempSrc: Oral Oral Oral Oral  SpO2: 100% 100% 100% 100%  Weight:      Height:        Fundus firm FHT: 125 mod var, no current accels or decels, but Cat 1 prior.  One 59min prolonged decel around 06:30 with good recovery with position change.  Has had a 1-2 of these per shift, but overall Cat 1 and very reassuring. TOCO: quiet SVE: deferred  Lab Results  Component Value Date   WBC 10.6 (H) 07/28/2018   HGB 10.3 (L) 07/28/2018   HCT 33.0 (L) 07/28/2018   MCV 82.1 07/28/2018   PLT 315 07/28/2018    --/--/A NEG (02/28 1612)  A/P HD# 3 PPROM 33.3 S/p beta x 2 S/p IV abx, continue current oral regimen of abx Continuous fetal monitoring Other  Routine care.      Allyn Kenner

## 2018-07-31 LAB — TYPE AND SCREEN
ABO/RH(D): A NEG
Antibody Screen: POSITIVE
UNIT DIVISION: 0
Unit division: 0

## 2018-07-31 LAB — BPAM RBC
Blood Product Expiration Date: 202003162359
Blood Product Expiration Date: 202003182359
Unit Type and Rh: 600
Unit Type and Rh: 600

## 2018-07-31 NOTE — Progress Notes (Signed)
HD# 4 PPROM  S: Slept poorly last night. Has had a bit more cramping today. Active FM, no VB, continued LOF O:  Vitals:   07/30/18 2307 07/31/18 0525 07/31/18 0818 07/31/18 0819  BP: (!) 112/52 113/68  119/75  Pulse: 63 71  64  Resp:  18 18   Temp: 98.6 F (37 C) 97.9 F (36.6 C) 98.6 F (37 C)   TempSrc: Oral Oral Oral   SpO2: 100% 100% 100% 100%  Weight:      Height:       AOX3, NAD FHR 140 reactive, cat 1 tracing Cvx deferred toco irritability  A/P 33+5 week IUP with PPROM 1) Will change fetal monitoring to NST Q shift 2) FWE breassuring 3) Continue abx for latency 4) Will be 34 on Wednesday, will plan IOL at that time. Pt with 2 prior successful VBACs, will attempt TOL

## 2018-07-31 NOTE — Plan of Care (Signed)
  Problem: Safety: Goal: Ability to remain free from injury will improve Outcome: Progressing   Problem: Education: Goal: Knowledge of disease or condition will improve Outcome: Progressing

## 2018-08-01 NOTE — Progress Notes (Signed)
Pt has requested to be induced by a different provider. Dr. Ouida Sills and house coverage RN made aware.

## 2018-08-01 NOTE — Progress Notes (Signed)
Pt has no c/o. Still leaking fluid. To be induced tomorrow as she will be [redacted] weeks EGA. On exam cx is closed. She plans to VBAC.  Good FM, Mild cramping.  VSSAF IMP/ Stable Plan/ Will start cytotec induction tonight.

## 2018-08-02 ENCOUNTER — Other Ambulatory Visit: Payer: Self-pay

## 2018-08-02 ENCOUNTER — Encounter (HOSPITAL_COMMUNITY): Payer: Self-pay

## 2018-08-02 ENCOUNTER — Inpatient Hospital Stay (HOSPITAL_COMMUNITY): Payer: Medicaid Other | Admitting: Anesthesiology

## 2018-08-02 DIAGNOSIS — Z3490 Encounter for supervision of normal pregnancy, unspecified, unspecified trimester: Secondary | ICD-10-CM

## 2018-08-02 LAB — CBC
HCT: 34.2 % — ABNORMAL LOW (ref 36.0–46.0)
Hemoglobin: 10.4 g/dL — ABNORMAL LOW (ref 12.0–15.0)
MCH: 25.1 pg — ABNORMAL LOW (ref 26.0–34.0)
MCHC: 30.4 g/dL (ref 30.0–36.0)
MCV: 82.6 fL (ref 80.0–100.0)
Platelets: 383 10*3/uL (ref 150–400)
RBC: 4.14 MIL/uL (ref 3.87–5.11)
RDW: 15.8 % — ABNORMAL HIGH (ref 11.5–15.5)
WBC: 16.9 10*3/uL — AB (ref 4.0–10.5)
nRBC: 0 % (ref 0.0–0.2)

## 2018-08-02 MED ORDER — OXYCODONE-ACETAMINOPHEN 5-325 MG PO TABS: 1.0000 | ORAL_TABLET | ORAL | Status: DC | PRN

## 2018-08-02 MED ORDER — FENTANYL-BUPIVACAINE-NACL 0.5-0.125-0.9 MG/250ML-% EP SOLN
12.0000 mL/h | EPIDURAL | Status: DC | PRN
  Filled 2018-08-02: qty 250

## 2018-08-02 MED ORDER — PRENATAL MULTIVITAMIN CH
1.0000 | ORAL_TABLET | Freq: Every day | ORAL | Status: DC
  Administered 2018-08-03 – 2018-08-05 (×3): 1 via ORAL
  Filled 2018-08-02 (×3): qty 1

## 2018-08-02 MED ORDER — ACETAMINOPHEN 325 MG PO TABS
650.0000 mg | ORAL_TABLET | ORAL | Status: DC | PRN
  Administered 2018-08-02 – 2018-08-04 (×3): 650 mg via ORAL
  Filled 2018-08-02 (×3): qty 2

## 2018-08-02 MED ORDER — OXYTOCIN 40 UNITS IN NORMAL SALINE INFUSION - SIMPLE MED: 2.5000 [IU]/h | INTRAVENOUS | Status: DC

## 2018-08-02 MED ORDER — EPHEDRINE 5 MG/ML INJ: 10.0000 mg | INTRAVENOUS | Status: DC | PRN

## 2018-08-02 MED ORDER — ONDANSETRON HCL 4 MG/2ML IJ SOLN: 4.0000 mg | Freq: Four times a day (QID) | INTRAMUSCULAR | Status: DC | PRN

## 2018-08-02 MED ORDER — DIBUCAINE 1 % RE OINT: 1.0000 "application " | TOPICAL_OINTMENT | RECTAL | Status: DC | PRN

## 2018-08-02 MED ORDER — LACTATED RINGERS IV SOLN: INTRAVENOUS | Status: DC

## 2018-08-02 MED ORDER — WITCH HAZEL-GLYCERIN EX PADS: 1.0000 "application " | MEDICATED_PAD | CUTANEOUS | Status: DC | PRN

## 2018-08-02 MED ORDER — PHENYLEPHRINE 40 MCG/ML (10ML) SYRINGE FOR IV PUSH (FOR BLOOD PRESSURE SUPPORT)
80.0000 ug | PREFILLED_SYRINGE | INTRAVENOUS | Status: DC | PRN
  Administered 2018-08-02: 80 ug via INTRAVENOUS
  Filled 2018-08-02: qty 10

## 2018-08-02 MED ORDER — ONDANSETRON HCL 4 MG PO TABS: 4.0000 mg | ORAL_TABLET | ORAL | Status: DC | PRN

## 2018-08-02 MED ORDER — LACTATED RINGERS IV SOLN: 500.0000 mL | INTRAVENOUS | Status: DC | PRN

## 2018-08-02 MED ORDER — ACETAMINOPHEN 325 MG PO TABS: 650.0000 mg | ORAL_TABLET | ORAL | Status: DC | PRN

## 2018-08-02 MED ORDER — BENZOCAINE-MENTHOL 20-0.5 % EX AERO: 1.0000 "application " | INHALATION_SPRAY | CUTANEOUS | Status: DC | PRN

## 2018-08-02 MED ORDER — TERBUTALINE SULFATE 1 MG/ML IJ SOLN: 0.2500 mg | Freq: Once | INTRAMUSCULAR | Status: DC | PRN

## 2018-08-02 MED ORDER — IBUPROFEN 600 MG PO TABS
600.0000 mg | ORAL_TABLET | Freq: Four times a day (QID) | ORAL | Status: DC
  Administered 2018-08-02 – 2018-08-05 (×12): 600 mg via ORAL
  Filled 2018-08-02 (×12): qty 1

## 2018-08-02 MED ORDER — ZOLPIDEM TARTRATE 5 MG PO TABS: 5.0000 mg | ORAL_TABLET | Freq: Every evening | ORAL | Status: DC | PRN

## 2018-08-02 MED ORDER — DIPHENHYDRAMINE HCL 25 MG PO CAPS: 25.0000 mg | ORAL_CAPSULE | Freq: Four times a day (QID) | ORAL | Status: DC | PRN

## 2018-08-02 MED ORDER — COCONUT OIL OIL: 1.0000 "application " | TOPICAL_OIL | Status: DC | PRN

## 2018-08-02 MED ORDER — DIPHENHYDRAMINE HCL 50 MG/ML IJ SOLN: 12.5000 mg | INTRAMUSCULAR | Status: DC | PRN

## 2018-08-02 MED ORDER — FENTANYL CITRATE (PF) 100 MCG/2ML IJ SOLN: 100.0000 ug | INTRAMUSCULAR | Status: DC | PRN

## 2018-08-02 MED ORDER — OXYTOCIN BOLUS FROM INFUSION
500.0000 mL | Freq: Once | INTRAVENOUS | Status: AC
  Administered 2018-08-02: 999 mL via INTRAVENOUS

## 2018-08-02 MED ORDER — LACTATED RINGERS IV SOLN: 500.0000 mL | Freq: Once | INTRAVENOUS | Status: DC

## 2018-08-02 MED ORDER — SOD CITRATE-CITRIC ACID 500-334 MG/5ML PO SOLN: 30.0000 mL | ORAL | Status: DC | PRN

## 2018-08-02 MED ORDER — SIMETHICONE 80 MG PO CHEW
80.0000 mg | CHEWABLE_TABLET | ORAL | Status: DC | PRN
  Filled 2018-08-02: qty 1

## 2018-08-02 MED ORDER — TETANUS-DIPHTH-ACELL PERTUSSIS 5-2.5-18.5 LF-MCG/0.5 IM SUSP: 0.5000 mL | Freq: Once | INTRAMUSCULAR | Status: DC

## 2018-08-02 MED ORDER — LIDOCAINE HCL (PF) 1 % IJ SOLN: 30.0000 mL | INTRAMUSCULAR | Status: DC | PRN

## 2018-08-02 MED ORDER — SODIUM CHLORIDE (PF) 0.9 % IJ SOLN
INTRAMUSCULAR | Status: DC | PRN
  Administered 2018-08-02: 12 mL/h via EPIDURAL

## 2018-08-02 MED ORDER — SENNOSIDES-DOCUSATE SODIUM 8.6-50 MG PO TABS
2.0000 | ORAL_TABLET | ORAL | Status: DC
  Administered 2018-08-02 – 2018-08-03 (×2): 2 via ORAL
  Filled 2018-08-02 (×2): qty 2

## 2018-08-02 MED ORDER — OXYCODONE-ACETAMINOPHEN 5-325 MG PO TABS: 2.0000 | ORAL_TABLET | ORAL | Status: DC | PRN

## 2018-08-02 MED ORDER — SOD CITRATE-CITRIC ACID 500-334 MG/5ML PO SOLN
30.0000 mL | Freq: Once | ORAL | Status: AC
  Administered 2018-08-02: 30 mL via ORAL
  Filled 2018-08-02: qty 15

## 2018-08-02 MED ORDER — OXYTOCIN 40 UNITS IN NORMAL SALINE INFUSION - SIMPLE MED
1.0000 m[IU]/min | INTRAVENOUS | Status: DC
  Administered 2018-08-02: 2 m[IU]/min via INTRAVENOUS
  Filled 2018-08-02: qty 1000

## 2018-08-02 MED ORDER — ONDANSETRON HCL 4 MG/2ML IJ SOLN: 4.0000 mg | INTRAMUSCULAR | Status: DC | PRN

## 2018-08-02 MED ORDER — LIDOCAINE HCL (PF) 1 % IJ SOLN
INTRAMUSCULAR | Status: DC | PRN
  Administered 2018-08-02: 5 mL via EPIDURAL
  Administered 2018-08-02: 7 mL via EPIDURAL

## 2018-08-02 MED ORDER — PHENYLEPHRINE 40 MCG/ML (10ML) SYRINGE FOR IV PUSH (FOR BLOOD PRESSURE SUPPORT): 80.0000 ug | PREFILLED_SYRINGE | INTRAVENOUS | Status: DC | PRN

## 2018-08-02 NOTE — Anesthesia Procedure Notes (Signed)
Epidural Patient location during procedure: OB Start time: 08/02/2018 11:50 AM End time: 08/02/2018 11:54 AM  Staffing Anesthesiologist: Lyn Hollingshead, MD Performed: anesthesiologist   Preanesthetic Checklist Completed: patient identified, site marked, surgical consent, pre-op evaluation, timeout performed, IV checked, risks and benefits discussed and monitors and equipment checked  Epidural Patient position: sitting Prep: site prepped and draped and DuraPrep Patient monitoring: continuous pulse ox and blood pressure Approach: midline Location: L3-L4 Injection technique: LOR air  Needle:  Needle type: Tuohy  Needle gauge: 17 G Needle length: 9 cm and 9 Needle insertion depth: 8 cm Catheter type: closed end flexible Catheter size: 19 Gauge Catheter at skin depth: 12 cm Test dose: negative and Other  Assessment Sensory level: T9 Events: blood not aspirated, injection not painful, no injection resistance, negative IV test and no paresthesia  Additional Notes Reason for block:procedure for pain

## 2018-08-02 NOTE — Progress Notes (Signed)
Pt comfortable but starting to feel some contractions. Advised RN IV pain meds, epidural authorized when pt requests. GBS Neg Bedside US today: cephalic FHT: Cat 1 Cont. Current mgmt

## 2018-08-02 NOTE — Anesthesia Pain Management Evaluation Note (Signed)
  CRNA Pain Management Visit Note  Patient: Jillian Mercer, 33 y.o., female  "Hello I am a member of the anesthesia team at [facility] and [employer]. We have an anesthesia team available at all times to provide care throughout the hospital, including epidural management and anesthesia for C-section. I don't know your plan for the delivery whether it a natural birth, water birth, IV sedation, nitrous supplementation, doula or epidural, but we want to meet your pain goals."   1.Was your pain managed to your expectations on prior hospitalizations?   Yes   2.What is your expectation for pain management during this hospitalization?     Epidural  3.How can we help you reach that goal? epidural  Record the patient's initial score and the patient's pain goal.   Pain: 0  Pain Goal: 9 [facility] wants you to be able to say your pain was always managed very well.  Everette Rank 08/02/2018

## 2018-08-02 NOTE — Anesthesia Postprocedure Evaluation (Signed)
Anesthesia Post Note  Patient: Engineer, maintenance (IT)  Procedure(s) Performed: AN AD [place]     Patient location during evaluation: Mother Baby Anesthesia Type: Epidural Level of consciousness: awake Pain management: satisfactory to patient Vital Signs Assessment: post-procedure vital signs reviewed and stable Respiratory status: spontaneous breathing Cardiovascular status: stable Anesthetic complications: no    Last Vitals:  Vitals:   08/02/18 1605 08/02/18 1705  BP: 128/78 (!) 147/81  Pulse: 85 85  Resp: 17 18  Temp: 37.1 C 37 C  SpO2:      Last Pain:  Vitals:   08/02/18 1705  TempSrc: Oral  PainSc:    Pain Goal: Patients Stated Pain Goal: 2 (07/31/18 2000)                 Casimer Lanius

## 2018-08-02 NOTE — Lactation Note (Signed)
This note was copied from a baby's chart. Lactation Consultation Note  Patient Name: Jillian Mercer [id number] Date: 08/02/2018 Reason for consult: Initial assessment;Late-preterm 34-36.6wks;Infant < 6lbs;NICU baby  Initial visit of P2 mom on 3rd floor in couplet care with baby at bedside. Baby del @ 34wks, now 64 hours old. Mom has been bottle-feeding with donor breast milk since birth. Mom has been holding baby STS for extended periods with assistance from NICU RN. DEBP at bedside and mom states she just finished pumping. Hand expressed and collected few drops in snappy, few drops collected in bottle of pump parts.  Mom states she tried to breastfeed her now 33yo, but was only able to provide breastmilk for about 3 weeks due to low milk supply. Mom states her first baby would latch but she wasn't producing enough and needed to supplement. Mom eventually transitioned to exclusive formula feeding as her milk supply diminished. Significant other at bedside and very supportive of mom and breastfeeding this baby.  Demonstrated breast massage and hand expression to mom. Drops produced with minimal stimulation by LC. Encouraged parents to collect any amount, as any amount of colostrum/breastmilk is valuable for baby.  Colostrum in pump part placed in snappy at bedside and encouraged parents to use with next feeding via finger or syringe.  Reviewed pump kit set up and initiation mode of DEBP. Instructed mom to pump q3hrs and discussed possibility of pumping with baby STS with assistance from FOB and/or RN. Reviewed disassembly and cleaning of pump parts after each use. Pieces washed by LC and observed by FOB who is very engaged in the process. Discussed and reinforced prior information given by NICU RN regarding storage of breastmilk.   Mom using 63m flange on DEBP and states her nipple was sliding freely in and out without her nipple touching the flange when pumping. Mom states her right nipple wasn't  moving at all during pumping, encouraged mom to center flange on her nipple. Encouraged mom to pump both sides simultaneously for 15 minutes at a time.  Mom with questions regarding a pump at discharge and [facility] Informed mom of other options of obtaining a breast [facility] out [facility].  LC will notify mom's nurse [facility]  Informed mom anticipated milk production around 3 days post-partum and most likely would have her own milk supply available once infant eligible to be put to breast for breastfeeding.  Mom given lactation brochure with phone number and notified of IP/OP lactation services and breast feeding support group.  Mom given LPTI feeding guidelines handout and booklet providing milk for your NICU baby.  Maternal Data Has patient been taught Hand Expression?: Yes(demonstrated and reinforced) Does the patient have breastfeeding experience prior to this delivery?: Yes  Feeding Feeding Type: Donor Breast Milk Nipple Type: Slow - flow  Interventions Interventions: Breast feeding basics reviewed;Skin to skin;Breast massage;Hand express;Expressed milk;DEBP  Lactation Tools Discussed/Used Tools: Pump Breast pump type: Double-Electric Breast Pump WIC Program: No Pump Review: Setup, frequency, and cleaning;Milk Storage Initiated by:: NICU RN Date initiated:: 08/02/18   Consult Status Consult Status: Follow-up Date: 08/03/18 Follow-up type: In-patient    CCranston Neighbor3/08/2018, 10:28 PM

## 2018-08-02 NOTE — Anesthesia Preprocedure Evaluation (Signed)
Anesthesia Evaluation  Patient identified by MRN, date of birth, ID band Patient awake    Reviewed: Allergy & Precautions, H&P , NPO status , Patient's Chart, lab work & pertinent test results  Airway Mallampati: III  TM Distance: >3 FB Neck ROM: full    Dental no notable dental hx. (+) Teeth Intact   Pulmonary    Pulmonary exam normal breath sounds clear to auscultation       Cardiovascular negative cardio ROS Normal cardiovascular exam Rhythm:regular Rate:Normal     Neuro/Psych negative neurological ROS  negative psych ROS   GI/Hepatic negative GI ROS,   Endo/Other  Morbid obesity  Renal/GU   negative genitourinary   Musculoskeletal   Abdominal (+) + obese,   Peds  Hematology  (+) Blood dyscrasia, anemia ,   Anesthesia Other Findings   Reproductive/Obstetrics (+) Pregnancy                             Anesthesia Physical Anesthesia Plan  ASA: III  Anesthesia Plan: Epidural   Post-op Pain Management:    Induction:   PONV Risk Score and Plan:   Airway Management Planned:   Additional Equipment:   Intra-op Plan:   Post-operative Plan:   Informed Consent: I have reviewed the patients History and Physical, chart, labs and discussed the procedure including the risks, benefits and alternatives for the proposed anesthesia with the patient or authorized representative who has indicated his/her understanding and acceptance.       Plan Discussed with:   Anesthesia Plan Comments:         Anesthesia Quick Evaluation

## 2018-08-03 LAB — CBC
HCT: 27.6 % — ABNORMAL LOW (ref 36.0–46.0)
Hemoglobin: 8.8 g/dL — ABNORMAL LOW (ref 12.0–15.0)
MCH: 26.3 pg (ref 26.0–34.0)
MCHC: 31.9 g/dL (ref 30.0–36.0)
MCV: 82.4 fL (ref 80.0–100.0)
NRBC: 0 % (ref 0.0–0.2)
PLATELETS: 342 10*3/uL (ref 150–400)
RBC: 3.35 MIL/uL — ABNORMAL LOW (ref 3.87–5.11)
RDW: 15.9 % — AB (ref 11.5–15.5)
WBC: 17.5 10*3/uL — ABNORMAL HIGH (ref 4.0–10.5)

## 2018-08-03 MED ORDER — RHO D IMMUNE GLOBULIN 1500 UNIT/2ML IJ SOSY
300.0000 ug | PREFILLED_SYRINGE | Freq: Once | INTRAMUSCULAR | Status: AC
  Administered 2018-08-03: 300 ug via INTRAVENOUS
  Filled 2018-08-03: qty 2

## 2018-08-03 MED ORDER — FAMOTIDINE 20 MG PO TABS
20.0000 mg | ORAL_TABLET | Freq: Two times a day (BID) | ORAL | Status: DC | PRN
  Administered 2018-08-03: 20 mg via ORAL
  Filled 2018-08-03: qty 1

## 2018-08-03 NOTE — Lactation Note (Signed)
This note was copied from a baby's chart. Lactation Consultation Note  Patient Name: Jillian Mercer [id number] Date: 08/03/2018  Mom is pumping and hand expressing every 3 hours and obtaining drops.  She is holding baby skin to skin.  Mom will need a pump after discharge.  Saline referral sent to [facility] office.  Encouraged to call with concerns prn.   Maternal Data    Feeding Feeding Type: Donor Breast Milk  LATCH Score                   Interventions    Lactation Tools Discussed/Used     Consult Status      Ave Filter 08/03/2018, 11:02 AM

## 2018-08-03 NOTE — Progress Notes (Signed)
Patient is doing well.  She is ambulating, voiding, tolerating PO.  Pain control is good, some back pain at epidural site.  C/o heartburn. Lochia is appropriate.   Vitals:   08/02/18 1705 08/02/18 2116 08/03/18 0136 08/03/18 0504  BP: (!) 147/81 125/73 (!) 121/57 112/62  Pulse: 85 96 89 79  Resp: 18 16 16 16   Temp: 98.6 F (37 C) 98.5 F (36.9 C) 98.7 F (37.1 C) 97.8 F (36.6 C)  TempSrc: Oral Oral Oral Axillary  SpO2:  96%    Weight:      Height:        NAD Fundus firm  Lab Results  Component Value Date   WBC 17.5 (H) 08/03/2018   HGB 8.8 (L) 08/03/2018   HCT 27.6 (L) 08/03/2018   MCV 82.4 08/03/2018   PLT 342 08/03/2018    --/--/A NEG (03/02 0726)/[employer]Immune  A/P 33 y.o. [id number] PPD#1 s/p VBAC at 34wks w PPROM. Routine care.   A neg--rhogam prior to d/c (baby rh +) Pepcid for heartburn prn Expect d/c tomorrow.    [place]

## 2018-08-04 LAB — TYPE AND SCREEN
ABO/RH(D): A NEG
Antibody Screen: POSITIVE
UNIT DIVISION: 0
Unit division: 0

## 2018-08-04 LAB — RH IG WORKUP (INCLUDES ABO/RH)
ABO/RH(D): A NEG
Fetal Screen: NEGATIVE
Gestational Age(Wks): 34
Unit division: 0
Unit tag comment: 34

## 2018-08-04 LAB — BPAM RBC
Blood Product Expiration Date: 202003232359
Blood Product Expiration Date: 202003232359
Unit Type and Rh: 600
Unit Type and Rh: 600

## 2018-08-04 MED ORDER — OXYCODONE-ACETAMINOPHEN 5-325 MG PO TABS: 1.0000 | ORAL_TABLET | ORAL | 0 refills | Status: DC | PRN

## 2018-08-04 MED ORDER — LORATADINE 10 MG PO TABS
10.0000 mg | ORAL_TABLET | Freq: Every day | ORAL | Status: DC
  Administered 2018-08-04: 10 mg via ORAL
  Filled 2018-08-04: qty 1

## 2018-08-04 MED ORDER — GUAIFENESIN 100 MG/5ML PO SOLN
15.0000 mL | Freq: Four times a day (QID) | ORAL | Status: DC | PRN
  Administered 2018-08-04 – 2018-08-05 (×2): 300 mg via ORAL
  Filled 2018-08-04 (×2): qty 15

## 2018-08-04 NOTE — Lactation Note (Signed)
This note was copied from a baby's chart. Lactation Consultation Note  Patient Name: Jillian Mercer ONGEX'B Date: 08/04/2018  Mom allowing baby to nuzzle at breast.  Instructed to continue pumping and hand expressing every 3 hours.  She is obtaining 5 mls.  No questions or concerns.  Encouraged to call with concerns prn.   Maternal Data    Feeding Feeding Type: Donor Breast Milk  LATCH Score                   Interventions Interventions: Skin to skin  Lactation Tools Discussed/Used     Consult Status      Ave Filter 08/04/2018, 12:23 PM

## 2018-08-04 NOTE — Discharge Summary (Signed)
Obstetric Discharge Summary Reason for Admission: rupture of membranes Prenatal Procedures: none Intrapartum Procedures: spontaneous vaginal delivery Postpartum Procedures: Rho(D) Ig Complications-Operative and Postpartum: none Hemoglobin  Date Value Ref Range Status  08/03/2018 8.8 (L) 12.0 - 15.0 g/dL Final   HCT  Date Value Ref Range Status  08/03/2018 27.6 (L) 36.0 - 46.0 % Final    Discharge Diagnoses: Term Pregnancy-delivered  Discharge Information: Date: 08/04/2018 Activity: pelvic rest Diet: routine Medications: Ibuprofen and Iron Condition: stable Instructions: refer to practice specific booklet Discharge to: home Follow-up Information    Jillian Kenner, Jillian Mercer Follow up in 4 week(s).   Specialty:  Obstetrics and Gynecology Contact information: [address] [place] [address] [phone]           Newborn Data: Live born female  Birth Weight: 3 lb 13.7 oz (1750 g) APGAR: 79, 9  Newborn Delivery   Birth date/time:  [birth year]/2018 14:20:00 Delivery type:  Vaginal, Spontaneous     Home with mother.  Jillian Mercer 08/04/2018, 9:02 AM

## 2018-08-04 NOTE — Progress Notes (Signed)
D/C'd cancelled per MD until tomorrow (08/05/2018)

## 2018-08-04 NOTE — Progress Notes (Signed)
RN to hold D/C orders at this time per MD. MD notified of patient's HA, rating pain 5/10, decreasing to 4/10 after tylenol (see MAR). V/S and other assessments WDL. Will continue to monitor and notify physician when patient's HA resolves.

## 2018-08-04 NOTE — Progress Notes (Addendum)
Patient is eating, ambulating, voiding.  Pain control is good but woke up not feeling well and had very bad HA.  HA better now with eating and tylenol.  BPs are fine.  Vitals:   08/03/18 0504 08/03/18 1345 08/03/18 2114 08/04/18 0610  BP: 112/62 118/68 122/71 114/70  Pulse: 79 82 (!) 102 87  Resp: 16 18 17 16   Temp: 97.8 F (36.6 C) 99 F (37.2 C) 98.2 F (36.8 C) 98.4 F (36.9 C)  TempSrc: Axillary Axillary Oral Oral  SpO2:  99% 99% 100%  Weight:      Height:        Fundus firm Perineum without swelling.  Lab Results  Component Value Date   WBC 17.5 (H) 08/03/2018   HGB 8.8 (L) 08/03/2018   HCT 27.6 (L) 08/03/2018   MCV 82.4 08/03/2018   PLT 342 08/03/2018    --/--/A NEG (03/05 0647)/RI  A/P [id number] PPD#2 s/p VBAC at 34wks w PPROM. Routine care.   A neg--rhogam prior to d/c (baby rh +) Pepcid for heartburn prn Expect d/c today  - but will hold til after lunch to reassess headache and how she is feeling.    Jillian Mercer

## 2018-08-05 NOTE — Progress Notes (Signed)
CSW received consult for MOB due to her Edinburgh score being 13. CSW met with MOB and newborn at bedside to complete discussion of EPDS results. MOB stated that this is her fourth child, the others being ages 74, 71, and 91. MOB reports that this is her last child and that her husband is to get a vasectomy. MOB stated that a CSW came to meet with her yesterday and provided her with resources for NICU support groups. CSW and MOB discussed the groups and the support available to her at the hospital during newborn's NICU stay. MOB reports a great support system of family and friends. MOB and CSW discussed baby blues period versus postpartum depression. MOB reported that she experienced a small touch of PPD with one of her other children but reported it did not last long. CSW encouraged MOB to reach out to [facility] staff at any point for support, MOB agreeable.  No further concerns at this time.  Jillian Mercer, MSW, LCSW-A Clinical Social Worker [facility] [employer] [phone]

## 2018-08-05 NOTE — Progress Notes (Signed)
Pt c/o nasal drainage and cough. Requesting medication to ease symptoms; has [place]s lozenges from home at bedside. Called Dr. Philis Pique, orders received for Claritin and Robitussin.

## 2018-08-05 NOTE — Progress Notes (Signed)
Patient is eating, ambulating, voiding.  Pain control is good.  Vitals:   08/04/18 1415 08/04/18 1835 08/04/18 2154 08/05/18 0534  BP: 126/66 108/70 120/65 121/72  Pulse: 92 88 89 89  Resp: 16 16 20 20   Temp: 99.3 F (37.4 C) 98.8 F (37.1 C) (!) 97.5 F (36.4 C) 98.6 F (37 C)  TempSrc: Oral Temporal Oral Oral  SpO2:  100% 99% 100%  Weight:      Height:        Fundus firm Perineum without swelling.  Lab Results  Component Value Date   WBC 17.5 (H) 08/03/2018   HGB 8.8 (L) 08/03/2018   HCT 27.6 (L) 08/03/2018   MCV 82.4 08/03/2018   PLT 342 08/03/2018    --/--/A NEG (03/05 0647)/RI  A/P Post partum day 3.  Routine care.  Expect d/c pt feeling better today- will have pt take iron for anemia and continue tylenol as needed for headaches.  Pt received [place] A Enolia Koepke

## 2018-08-05 NOTE — Lactation Note (Signed)
This note was copied from a baby's chart. Lactation Consultation Note  Patient Name: Jillian Mercer Date: 08/05/2018 Reason for consult: Follow-up assessment;Late-preterm 34-36.6wks;NICU baby;Infant weight loss  Baby is 70 hours old/ mom for D/C today.  As LC entered the room/ mom pumping left breast with #24 F.  LC reviewed supply and demand and recommended trying to pump both breast together.  Mom mentioned it was difficult. Pumping in 24 hours is recommended for 15 - 20 mins 8-12 times to establish  And protect milk supply.  LC recommended and showed mom if she allow her breast to hang naturally and used the larger 90 ml  Bottles holding like handles the pumping both breast would be easier/ and more comfortable.  LC switched the bottles and per mom more comfortable. EBM yield 24 ml.  LC explained to mom the maintenance mode.  Sore nipple and engorgement prevention and tx reviewed.  Previous [place] faxed a [facility] referral for this mother/ per mom WIC did not come see her or call her.  LC recommended renting a DEBP from the gift shop on the 1st floor/ also to call [facility] and leave a  Phone message for [facility] appt. . Mom aware the message won't be retrieved until Monday.  Also when visiting baby in NICU plan to pump in front of the baby to enhance let down.  LC praised mom for her efforts pumping/ and that her milk is coming in.  Mother informed of post-discharge support and given phone number to the lactation department, including services for phone call assistance; out-patient appointments; and breastfeeding support group. List of other breastfeeding resources in the community given in the handout. Encouraged mother to call for problems or concerns related to breastfeeding.  [place] number given to mom on the resource sheet.     Maternal Data Has patient been taught Hand Expression?: Yes  Feeding Feeding Type: Donor Breast Milk  LATCH Score                    Interventions Interventions: Breast feeding basics reviewed  Lactation Tools Discussed/Used Tools: Pump;Flanges(as LC entered the room / one breast at a time - see LC note - milk coming in ) Flange Size: 24 Breast pump type: Double-Electric Breast Pump Pump Review: Setup, frequency, and cleaning;Milk Storage(this LC reviewed / MAI 3/7 )   Consult Status Consult Status: PRN Date: (baby in NICU ) Follow-up type: Other (comment)    Jillian Mercer 08/05/2018, 12:44 PM

## 2018-08-05 NOTE — Discharge Instructions (Signed)
Breast Pumping Tips Breast pumping is a way to get milk out of your breasts. You will then store the milk for your baby to use when you are away from home. There are three ways to pump. You can:  Use your hand to massage and squeeze your breast (hand expression).  Use a hand-held machine to manually pump your milk.  Use an electric machine to pump your milk. In the beginning you may not get much milk. After a few days your breasts should make more. Pumping can help you start making milk after your baby is born. Pumping helps you to keep making milk when you are away from your baby. When should I pump? You can start pumping soon after your baby is born. Follow these tips:  When you are with your baby: ? Pump after you breastfeed. ? Pump from the free breast while you breastfeed.  When you are away from your baby: ? Pump every 2-3 hours for 15 minutes. ? Pump both breasts at the same time if you can.  If your baby drinks formula, pump around the time your baby gets the formula.  If you drank alcohol, wait 2 hours before you pump.  If you are going to have surgery, ask your doctor when you should pump again. How do I get ready to pump? Take steps to relax. Try these things to help your milk come in:  Smell your baby's blanket or clothes.  Look at a picture or video of your baby.  Sit in a quiet, private space.  Massage your breast and nipple.  Place a cloth on your breast. The cloth should be warm and a little wet.  Play relaxing music.  Picture your milk flowing. What are some tips? General tips for pumping breast milk  Always wash your hands before pumping.  If you do not get much milk or if pumping hurts, try different pump settings or a different kind of pump.  Drink enough fluid so your pee (urine) is clear or pale yellow.  Wear clothing that opens in the front or is easy to take off.  Pump milk into a clean bottle or container.  Do not use anything that has  nicotine or tobacco. Examples are cigarettes and e-cigarettes. If you need help quitting, ask your doctor. Tips for storing breast milk  Store breast milk in a clean, BPA-free container. These include: ? A glass or plastic bottle. ? A milk storage bag.  Store only 2-4 ounces of breast milk in each container.  Swirl the breast milk in the container. Do not shake it.  Write down the date you pumped the milk on the container.  This is how long you can store breast milk: ? Room temperature: 6-8 hours. It is best to use the milk within 4 hours. ? Cooler with ice packs: 24 hours. ? Refrigerator: 5-8 days, if the milk is clean. It is best to use the milk within 3 days. ? Freezer: 9-12 months, if the milk is clean and stored away from the freezer door. It is best to use the milk within 6 months.  Put milk in the back of the refrigerator or freezer.  Thaw frozen milk using warm water. Do not use the microwave. Tips for choosing a breast pump When choosing a pump, keep the following things in mind:  Manual breast pumps do not need electricity. They cost less. They can be hard to use.  Electric breast pumps use electricity. They are more  expensive. They are easier to use. They collect more milk.  The suction cup (flange) should be the right size.  Before you buy the pump, check if your insurance will pay for it. Tips for caring for a breast pump  Check the manual that came with your pump for cleaning tips.  Clean the pump after you use it. To do this: 1. Wipe down the electrical part. Use a dry cloth or paper towel. Do not put this part in water or in cleaning products. 2. Wash the plastic parts with soap and warm water. Or use the dishwasher if the manual says it is safe. You do not need to clean the tubing unless it touched breast milk. 3. Let all the parts air dry. Avoid drying them with a cloth or towel. 4. When the parts are clean and dry, put the pump back together. Then store the  pump.  If there is water in the tubing when you want to pump: 1. Attach the tubing to the pump. 2. Turn on the pump. 3. Turn off the pump when the tube is dry.  Try not to touch the inside of pump parts. Summary  Pumping can help you start making milk after your baby is born. It lets you keep making milk when you are away from your baby.  When you are away from your baby, pump for about 15 minutes every 2-3 hours. Pump both breasts at the same time, if you can. This information is not intended to replace advice given to you by your health care provider. Make sure you discuss any questions you have with your health care provider. Document Released: 11/03/2007 Document Revised: 06/21/2016 Document Reviewed: 06/21/2016 Elsevier Interactive Patient Education  2019 [place].  Vaginal Delivery, Care After Refer to this sheet in the next few weeks. These instructions provide you with information about caring for yourself after vaginal delivery. Your health care provider may also give you more specific instructions. Your treatment has been planned according to current medical practices, but problems sometimes occur. Call your health care provider if you have any problems or questions. What can I expect after the procedure? After vaginal delivery, it is common to have:  Some bleeding from your vagina.  Soreness in your abdomen, your vagina, and the area of skin between your vaginal opening and your anus (perineum).  Pelvic cramps.  Fatigue. Follow these instructions at home: Medicines  Take over-the-counter and prescription medicines only as told by your health care provider.  If you were prescribed an antibiotic medicine, take it as told by your health care provider. Do not stop taking the antibiotic until it is finished. Driving   Do not drive or operate heavy machinery while taking prescription pain medicine.  Do not drive for 24 hours if you received a  sedative. Lifestyle  Do not drink alcohol. This is especially important if you are breastfeeding or taking medicine to relieve pain.  Do not use tobacco products, including cigarettes, chewing tobacco, or e-cigarettes. If you need help quitting, ask your health care provider. Eating and drinking  Drink at least 8 eight-ounce glasses of water every day unless you are told not to by your health care provider. If you choose to breastfeed your baby, you may need to drink more water than this.  Eat high-fiber foods every day. These foods may help prevent or relieve constipation. High-fiber foods include: ? Whole grain cereals and breads. ? Brown rice. ? Beans. ? Fresh fruits and vegetables.  Activity  Return to your normal activities as told by your health care provider. Ask your health care provider what activities are safe for you.  Rest as much as possible. Try to rest or take a nap when your baby is sleeping.  Do not lift anything that is heavier than your baby or 10 lb (4.5 kg) until your health care provider says that it is safe.  Talk with your health care provider about when you can engage in sexual activity. This may depend on your: ? Risk of infection. ? Rate of healing. ? Comfort and desire to engage in sexual activity. Vaginal Care  If you have an episiotomy or a vaginal tear, check the area every day for signs of infection. Check for: ? More redness, swelling, or pain. ? More fluid or blood. ? Warmth. ? Pus or a bad smell.  Do not use tampons or douches until your health care provider says this is safe.  Watch for any blood clots that may pass from your vagina. These may look like clumps of dark red, brown, or black discharge. General instructions  Keep your perineum clean and dry as told by your health care provider.  Wear loose, comfortable clothing.  Wipe from front to back when you use the toilet.  Ask your health care provider if you can shower or take a  bath. If you had an episiotomy or a perineal tear during labor and delivery, your health care provider may tell you not to take baths for a certain length of time.  Wear a bra that supports your breasts and fits you well.  If possible, have someone help you with household activities and help care for your baby for at least a few days after you leave the hospital.  Keep all follow-up visits for you and your baby as told by your health care provider. This is important. Contact a health care provider if:  You have: ? Vaginal discharge that has a bad smell. ? Difficulty urinating. ? Pain when urinating. ? A sudden increase or decrease in the frequency of your bowel movements. ? More redness, swelling, or pain around your episiotomy or vaginal tear. ? More fluid or blood coming from your episiotomy or vaginal tear. ? Pus or a bad smell coming from your episiotomy or vaginal tear. ? A fever. ? A rash. ? Little or no interest in activities you used to enjoy. ? Questions about caring for yourself or your baby.  Your episiotomy or vaginal tear feels warm to the touch.  Your episiotomy or vaginal tear is separating or does not appear to be healing.  Your breasts are painful, hard, or turn red.  You feel unusually sad or worried.  You feel nauseous or you vomit.  You pass large blood clots from your vagina. If you pass a blood clot from your vagina, save it to show to your health care provider. Do not flush blood clots down the toilet without having your health care provider look at them.  You urinate more than usual.  You are dizzy or light-headed.  You have not breastfed at all and you have not had a menstrual period for 12 weeks after delivery.  You have stopped breastfeeding and you have not had a menstrual period for 12 weeks after you stopped breastfeeding. Get help right away if:  You have: ? Pain that does not go away or does not get better with medicine. ? Chest  pain. ? Difficulty breathing. ? Blurred  vision or spots in your vision. ? Thoughts about hurting yourself or your baby.  You develop pain in your abdomen or in one of your legs.  You develop a severe headache.  You faint.  You bleed from your vagina so much that you fill two sanitary pads in one hour. This information is not intended to replace advice given to you by your health care provider. Make sure you discuss any questions you have with your health care provider. Document Released: 05/14/2000 Document Revised: 10/29/2015 Document Reviewed: 06/01/2015 Elsevier Interactive Patient Education  2019 [employer].

## 2018-08-06 ENCOUNTER — Encounter (HOSPITAL_COMMUNITY): Payer: Self-pay | Admitting: Emergency Medicine

## 2018-08-06 ENCOUNTER — Emergency Department (HOSPITAL_COMMUNITY)
Admission: EM | Admit: 2018-08-06 | Discharge: 2018-08-06 | Disposition: A | Discharge status: Home / Self Care | Payer: Medicaid Other | Attending: Emergency Medicine | Admitting: Emergency Medicine

## 2018-08-06 ENCOUNTER — Emergency Department (HOSPITAL_COMMUNITY): Payer: Medicaid Other

## 2018-08-06 ENCOUNTER — Other Ambulatory Visit: Payer: Self-pay

## 2018-08-06 DIAGNOSIS — Z79899 Other long term (current) drug therapy: Secondary | ICD-10-CM | POA: Insufficient documentation

## 2018-08-06 DIAGNOSIS — R079 Chest pain, unspecified: Secondary | ICD-10-CM | POA: Diagnosis present

## 2018-08-06 DIAGNOSIS — J111 Influenza due to unidentified influenza virus with other respiratory manifestations: Secondary | ICD-10-CM | POA: Insufficient documentation

## 2018-08-06 LAB — CBC
HCT: 30.7 % — ABNORMAL LOW (ref 36.0–46.0)
Hemoglobin: 9.2 g/dL — ABNORMAL LOW (ref 12.0–15.0)
MCH: 25.2 pg — ABNORMAL LOW (ref 26.0–34.0)
MCHC: 30 g/dL (ref 30.0–36.0)
MCV: 84.1 fL (ref 80.0–100.0)
Platelets: 430 10*3/uL — ABNORMAL HIGH (ref 150–400)
RBC: 3.65 MIL/uL — ABNORMAL LOW (ref 3.87–5.11)
RDW: 16.6 % — AB (ref 11.5–15.5)
WBC: 11.4 10*3/uL — ABNORMAL HIGH (ref 4.0–10.5)
nRBC: 0.3 % — ABNORMAL HIGH (ref 0.0–0.2)

## 2018-08-06 LAB — I-STAT BETA HCG BLOOD, ED (MC, WL, AP ONLY): I-stat hCG, quantitative: 389.9 m[IU]/mL — ABNORMAL HIGH (ref <5)

## 2018-08-06 LAB — BASIC METABOLIC PANEL
Anion gap: 11 (ref 5–15)
BUN: 7 mg/dL (ref 6–20)
CO2: 20 mmol/L — ABNORMAL LOW (ref 22–32)
CREATININE: 1.03 mg/dL — AB (ref 0.44–1.00)
Calcium: 8.7 mg/dL — ABNORMAL LOW (ref 8.9–10.3)
Chloride: 107 mmol/L (ref 98–111)
GFR calc Af Amer: >60 mL/min (ref >60)
GFR calc non Af Amer: >60 mL/min (ref >60)
Glucose, Bld: 89 mg/dL (ref 70–99)
Potassium: 4.1 mmol/L (ref 3.5–5.1)
Sodium: 138 mmol/L (ref 135–145)

## 2018-08-06 LAB — I-STAT TROPONIN, ED: Troponin i, poc: 0 ng/mL (ref 0.00–0.08)

## 2018-08-06 MED ORDER — SODIUM CHLORIDE 0.9 % IV SOLN: INTRAVENOUS | Status: DC

## 2018-08-06 MED ORDER — SODIUM CHLORIDE 0.9% FLUSH
3.0000 mL | Freq: Once | INTRAVENOUS | Status: AC
  Administered 2018-08-06: 3 mL via INTRAVENOUS

## 2018-08-06 MED ORDER — ONDANSETRON HCL 4 MG/2ML IJ SOLN
4.0000 mg | Freq: Once | INTRAMUSCULAR | Status: AC
  Administered 2018-08-06: 4 mg via INTRAVENOUS
  Filled 2018-08-06: qty 2

## 2018-08-06 MED ORDER — SODIUM CHLORIDE 0.9 % IV BOLUS
1000.0000 mL | Freq: Once | INTRAVENOUS | Status: AC
  Administered 2018-08-06: 1000 mL via INTRAVENOUS

## 2018-08-06 MED ORDER — ACETAMINOPHEN 500 MG PO TABS
1000.0000 mg | ORAL_TABLET | Freq: Once | ORAL | Status: AC
  Administered 2018-08-06: 1000 mg via ORAL
  Filled 2018-08-06: qty 2

## 2018-08-06 NOTE — ED Triage Notes (Signed)
The patient said started with coughing, congestion and fever of 103.  The patient said her cough is productive but she does not spit the phlegm so does not know the color.  She originally came for the URI symptoms but walking in she began to have chest pain.  She rates her chest pain 3/10 but also has headache 7/10.  She has taken advil, tylenol and robitussin.  THe patient just had a baby three weeks ago.  She advised her husband has been sick with URI symptoms.

## 2018-08-06 NOTE — ED Notes (Signed)
Signature pad does not work on [employer]. Pt and family verbalized understanding of d/c instructions and follow up

## 2018-08-06 NOTE — Discharge Instructions (Addendum)
Tylenol and Motrin for the fever and body aches.  Since you are breast-feeding it sounds as if he cannot use Mucinex DM.  Drink plenty of fluids.  Out of the window for Tamiflu as we discussed.  Return for any new or worse symptoms

## 2018-08-06 NOTE — ED Provider Notes (Addendum)
[facility] EMERGENCY DEPARTMENT Provider Note   CSN: [id number] Arrival date & time: 08/06/18  1258    History   Chief Complaint Chief Complaint  Patient presents with  . Chest Pain    HPI Jillian Mercer is a 33 y.o. female.     Patient is postpartum.  Child is in the NICU born at 29 weeks.  Was vaginal delivery.  No complications from the mother standpoint.  Patient was just discharged home 4 days ago.  3 days ago she started with flulike symptoms.  Her husband was evaluated today as well and he tested positive for influenza A we will not test her she most likely has the exact same illness since they have the same symptoms and they came down with things at the same time.  Patient with some nausea not taking fluids well no vomiting has some headache body aches cough congestion sore throat.  Chest pain with cough.     Past Medical History:  Diagnosis Date  . Vertigo     Patient Active Problem List   Diagnosis Date Noted  . Pregnancy 08/02/2018  . Preterm premature rupture of membranes (PPROM) with unknown onset of labor 07/28/2018  . Cough 12/28/2016  . Dyspnea 04/14/2016    Past Surgical History:  Procedure Laterality Date  . CESAREAN SECTION  2007     OB History    Gravida  4   Para  4   Term  3   Preterm  1   AB      Living  4     SAB      TAB      Ectopic      Multiple  0   Live Births  1            Home Medications    Prior to Admission medications   Medication Sig Start Date End Date Taking? Authorizing Provider  acetaminophen (TYLENOL) 500 MG tablet Take 500-1,000 mg by mouth every 6 (six) hours as needed for mild pain or fever.    Yes [provider]  albuterol (PROAIR HFA) 108 (90 Base) MCG/ACT inhaler Inhale 2 puffs into the lungs every 6 (six) hours as needed for wheezing or shortness of breath.   Yes [provider]  famotidine (PEPCID) 10 MG tablet Take 10 mg by mouth 2 (two) times daily as  needed for heartburn or indigestion.    Yes [provider]  ibuprofen (ADVIL,MOTRIN) 200 MG tablet Take 600 mg by mouth every 6 (six) hours as needed (for fever or pain).   Yes [provider]  ondansetron (ZOFRAN-ODT) 4 MG disintegrating tablet Take 4 mg by mouth See admin instructions. Dissolve 4 mg under the tongue every 6-8 hours as needed for nausea and/or vomiting   Yes [provider]  Prenatal Vit-Fe Fumarate-FA (PRENATAL MULTIVITAMIN) TABS tablet Take 1 tablet by mouth daily.    Yes [provider]    Family History Family History  Problem Relation Age of Onset  . Lupus Mother   . Skin cancer Maternal Grandmother   . Hypertension Maternal Grandmother     Social History Social History   Tobacco Use  . Smoking status: Never Smoker  . Smokeless tobacco: Never Used  Substance Use Topics  . Alcohol use: No  . Drug use: No     Allergies   Penicillins   Review of Systems Review of Systems  Constitutional: Positive for fever. Negative for chills.  HENT:  Positive for congestion and sore throat. Negative for rhinorrhea.   Eyes: Negative for photophobia, redness and visual disturbance.  Respiratory: Negative for cough and shortness of breath.   Cardiovascular: Positive for chest pain. Negative for leg swelling.  Gastrointestinal: Positive for nausea. Negative for abdominal pain, diarrhea and vomiting.  Genitourinary: Negative for dysuria.  Musculoskeletal: Positive for myalgias. Negative for back pain and neck pain.  Skin: Negative for rash.  Neurological: Positive for headaches. Negative for dizziness and light-headedness.  Hematological: Does not bruise/bleed easily.  Psychiatric/Behavioral: Negative for confusion.     Physical Exam Updated Vital Signs BP (!) 111/59   Pulse (!) 102   Temp (!) 100.4 F (38 C) (Oral)   Resp (!) 21   Ht 1.651 m (5\' 5" )   Wt 115.7 kg   LMP 12/07/2017   SpO2 95%   BMI 42.43 kg/m   Physical  Exam Vitals signs and nursing note reviewed.  Constitutional:      General: She is not in acute distress.    Appearance: She is well-developed.  HENT:     Head: Normocephalic and atraumatic.     Nose: Congestion present.     Mouth/Throat:     Mouth: Mucous membranes are dry.     Pharynx: No posterior oropharyngeal erythema.  Eyes:     Extraocular Movements: Extraocular movements intact.     Conjunctiva/sclera: Conjunctivae normal.     Pupils: Pupils are equal, round, and reactive to light.  Neck:     Musculoskeletal: Normal range of motion and neck supple.  Cardiovascular:     Rate and Rhythm: Normal rate and regular rhythm.     Heart sounds: No murmur.  Pulmonary:     Effort: Pulmonary effort is normal. No respiratory distress.     Breath sounds: Normal breath sounds.  Abdominal:     Palpations: Abdomen is soft.     Tenderness: There is no abdominal tenderness.  Musculoskeletal: Normal range of motion.  Skin:    General: Skin is warm and dry.     Capillary Refill: Capillary refill takes less than 2 seconds.  Neurological:     General: No focal deficit present.     Mental Status: She is alert and oriented to person, place, and time.      ED Treatments / Results  Labs (all labs ordered are listed, but only abnormal results are displayed) Labs Reviewed  BASIC METABOLIC PANEL - Abnormal; Notable for the following components:      Result Value   CO2 20 (*)    Creatinine, Ser 1.03 (*)    Calcium 8.7 (*)    All other components within normal limits  CBC - Abnormal; Notable for the following components:   WBC 11.4 (*)    RBC 3.65 (*)    Hemoglobin 9.2 (*)    HCT 30.7 (*)    MCH 25.2 (*)    RDW 16.6 (*)    Platelets 430 (*)    nRBC 0.3 (*)    All other components within normal limits  I-STAT BETA HCG BLOOD, ED ([facility], [facility], [facility] ONLY) - Abnormal; Notable for the following components:   I-stat hCG, quantitative 389.9 (*)    All other components within normal limits  I-STAT  TROPONIN, ED    EKG EKG Interpretation  Date/Time:  Sunday August 06 2018 13:06:47 EDT Ventricular Rate:  113 PR Interval:  148 QRS Duration: 68 QT Interval:  318 QTC Calculation: 436 R Axis:   66 Text  Interpretation:  Sinus tachycardia Otherwise normal ECG No previous ECGs available Confirmed by Fredia Sorrow [phone]) on 08/06/2018 4:01:59 PM   Radiology Dg Chest 2 View  Result Date: 08/06/2018 CLINICAL DATA:  Productive cough.  Chest pain. EXAM: CHEST - 2 VIEW COMPARISON:  None. FINDINGS: The heart size and mediastinal contours are within normal limits. Both lungs are clear. The visualized skeletal structures are unremarkable. IMPRESSION: Negative two view chest x-ray Electronically Signed   By: San Morelle M.D.   On: 08/06/2018 14:27    Procedures Procedures (including critical care time)  Medications Ordered in ED Medications  0.9 %  sodium chloride infusion (has no administration in time range)  sodium chloride flush (NS) 0.9 % injection 3 mL (3 mLs Intravenous Given 08/06/18 1711)  sodium chloride 0.9 % bolus 1,000 mL (0 mLs Intravenous Stopped 08/06/18 1824)  ondansetron (ZOFRAN) injection 4 mg (4 mg Intravenous Given 08/06/18 1709)  acetaminophen (TYLENOL) tablet 1,000 mg (1,000 mg Oral Given 08/06/18 1709)     Initial Impression / Assessment and Plan / ED Course  I have reviewed the triage vital signs and the nursing notes.  Pertinent labs & imaging results that were available during my care of the patient were reviewed by me and considered in my medical decision making (see chart for details).        Patient's husband tested positive for influenza A here today they both got sick around the same time symptoms are very similar of sure she has influenza A.  Patient has a child in the NICU she is currently breast-feeding which is limiting some of the medications that she can have she is not a candidate for Tamiflu because onset of symptoms were 3 days ago at the same  time as her husband.  Nontoxic no acute distress was given some IV fluids here to help to boost her immune system and to make her feel better.  Patient will be treated symptomatically she will follow-up with her doctor.  She will return for any new or worse symptoms.  Basic labs without any significant abnormalities other than a mild elevation in the white blood cell count.  Patient had told the triage nurses that she had chest pain but that is pain with coughing her troponin was normal.  hCG was still elevated but she just had a delivery just discharged about 4 days ago.  Final Clinical Impressions(s) / ED Diagnoses   Final diagnoses:  Influenza    ED Discharge Orders    None       Fredia Sorrow, MD 08/06/18 1925    Fredia Sorrow, MD 08/06/18 [phone]

## 2018-08-06 NOTE — ED Notes (Signed)
Informed Chelsea of pt's temp.

## 2018-08-06 NOTE — ED Notes (Signed)
Pt in peds with children.

## 2020-02-05 IMAGING — US US MFM OB LIMITED
1 series · 13 of 13 positions shown · non-contrast
Comparison: none

[Series 1: us mfm ob limited · 13 acquisitions, 13 frames shown]
[im 1/13]
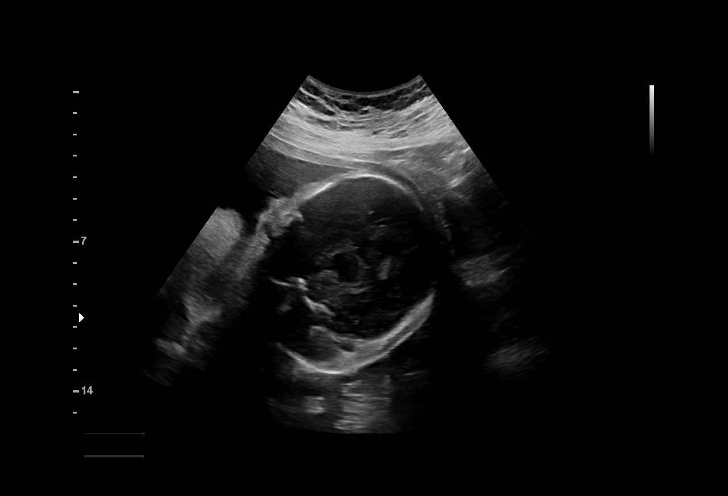
[im 2/13]
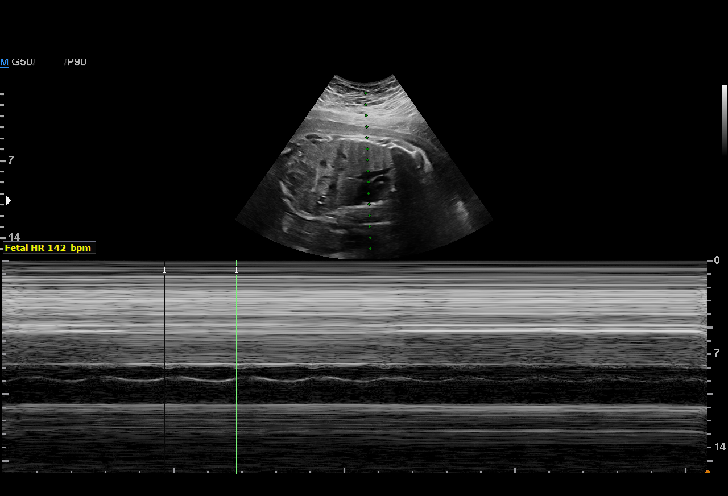
[im 3/13]
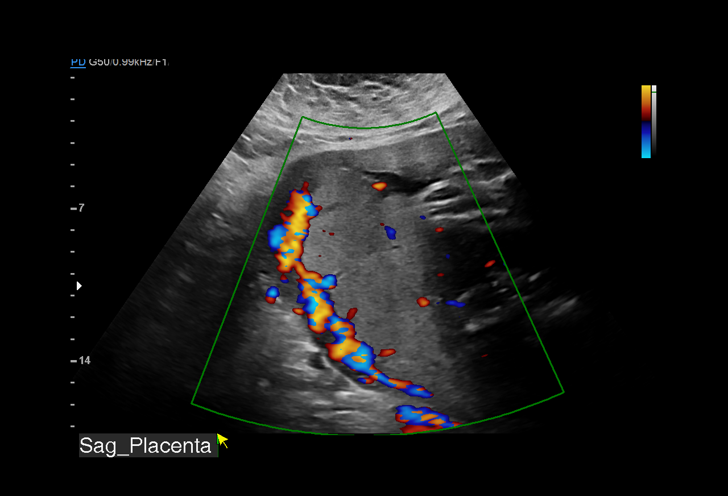
[im 4/13]
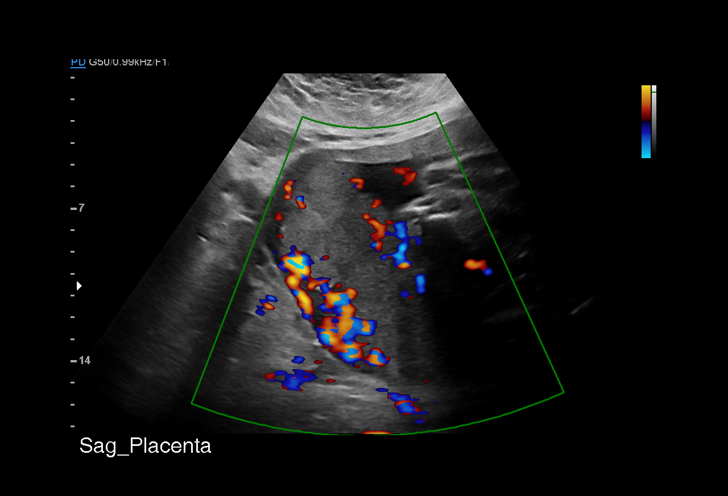
[im 5/13]
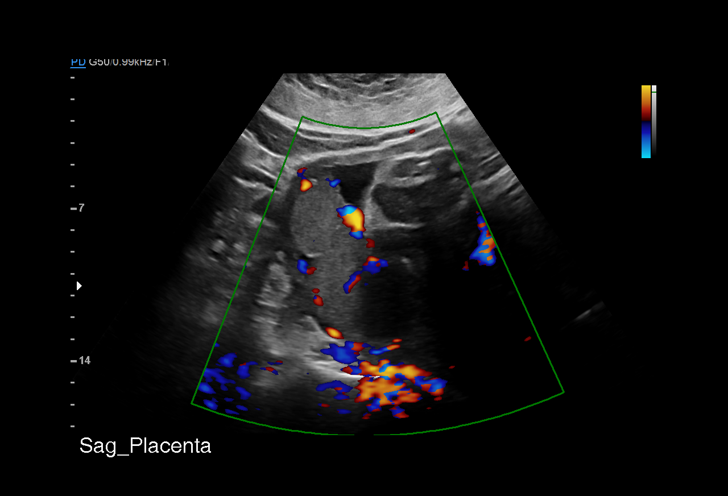
[im 6/13]
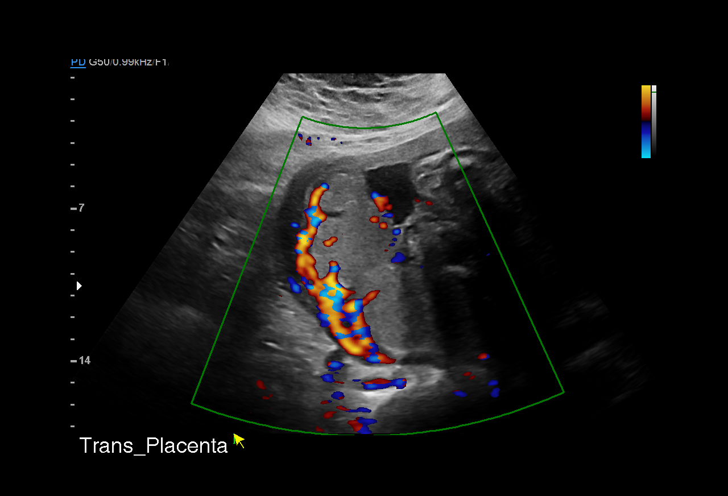
[im 7/13]
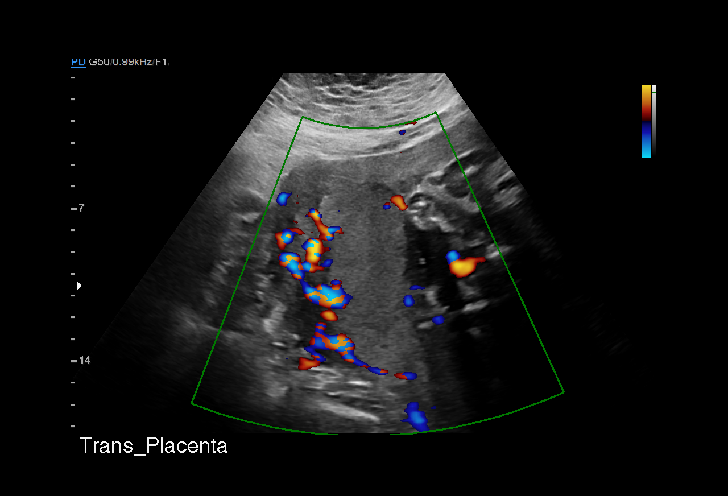
[im 8/13]
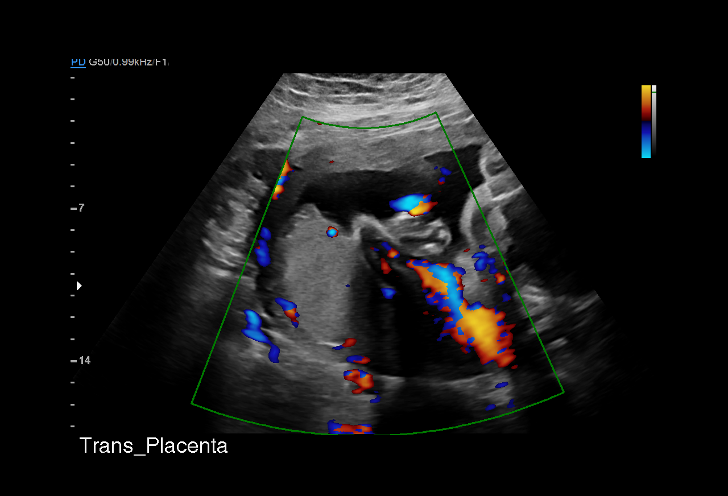
[im 9/13]
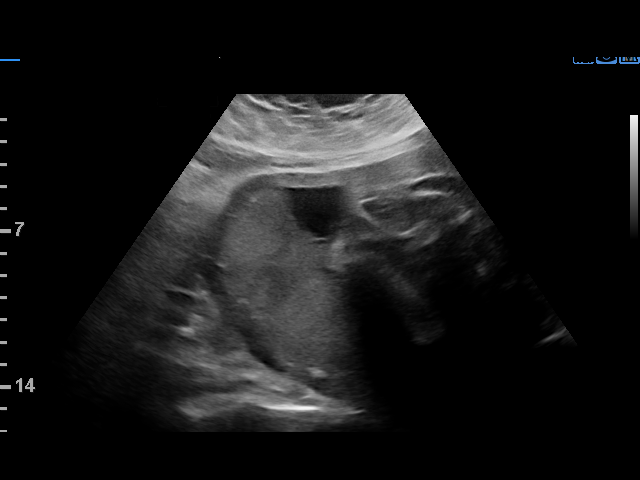
[im 10/13]
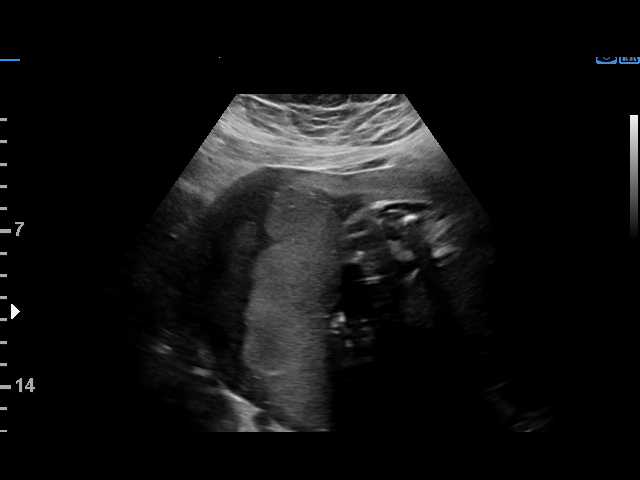
[im 11/13]
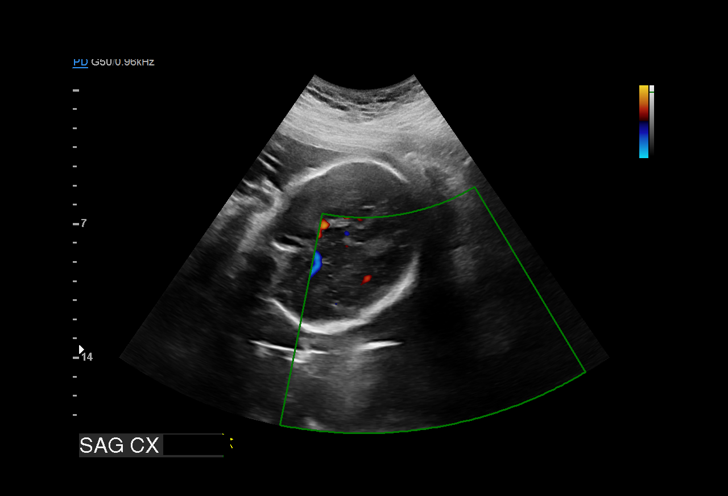
[im 12/13]
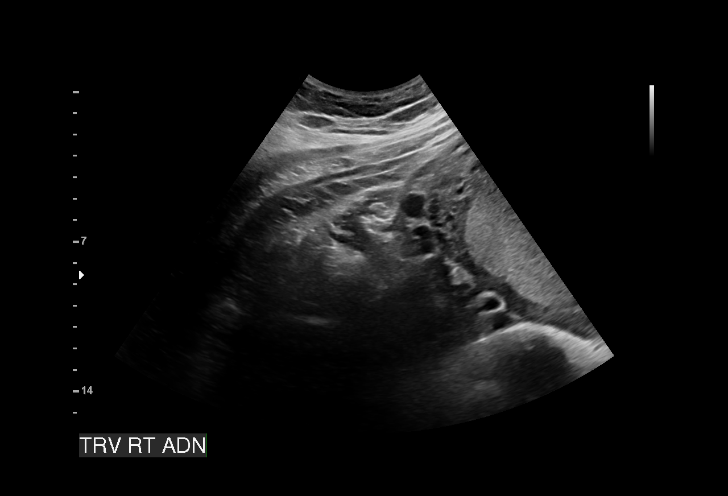
[im 13/13]
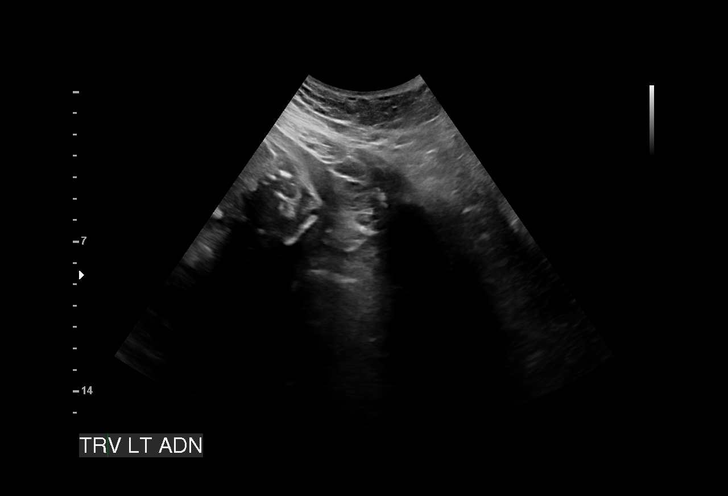

[13 of 13 positions shown; findings below may reference images not displayed]

Attending:        Rinnah Tiger        Secondary Phy.:    [facility]-
                                                             MAU/Triage
                   CNM

  1  US MFM OB LIMITED                    76815.01     FLO FUSTER
 ----------------------------------------------------------------------

 ----------------------------------------------------------------------
Indications

  30 weeks gestation of pregnancy
  Vaginal bleeding in pregnancy, third trimester
 ----------------------------------------------------------------------
Fetal Evaluation

 Num Of Fetuses:          1
 Fetal Heart Rate(bpm):   142
 Cardiac Activity:        Observed
 Presentation:            Cephalic
 Placenta:                Posterior
 P. Cord Insertion:       Not well visualized

 Amniotic Fluid
 AFI FV:      Within normal limits

 AFI Sum(cm)     %Tile       Largest Pocket(cm)
 12.28           32

 RUQ(cm)       RLQ(cm)       LUQ(cm)        LLQ(cm)
 3.28          4.54          0

 Comment:    No placental abruption or previa identified.
Gestational Age

 LMP:           30w 3d        Date:  12/08/17                 EDD:   09/14/18
 Best:          30w 3d     Det. By:  LMP  (12/08/17)          EDD:   09/14/18
Cervix Uterus Adnexa
 Cervix
 Not visualized (advanced GA >19wks)

 Left Ovary
 Not visualized.

 Right Ovary
 Not visualized.

 Adnexa
 No abnormality visualized.
Impression

 Patient was evaluated for c/o vaginal bleeding. On speculum
 examination, blood was seen in the vagina.

 A limited ultrasound study was performed. Amniotic fluid is
 normal and good fetal activity is seen. Placenta is posterior
 and there is no evidence of previa or accreta. NO evidence of
 placental abruption. On transabdominal scan, the cervix
 could not be visualized.
                 Mader, Rashard

## 2020-02-15 ENCOUNTER — Other Ambulatory Visit: Payer: Medicaid Other

## 2020-02-15 ENCOUNTER — Other Ambulatory Visit: Payer: Self-pay

## 2020-02-15 DIAGNOSIS — Z20822 Contact with and (suspected) exposure to covid-19: Secondary | ICD-10-CM

## 2020-02-18 LAB — NOVEL CORONAVIRUS, NAA: SARS-CoV-2, NAA: NOT DETECTED

## 2020-03-04 IMAGING — CR DG CHEST 2V
2 series · 2 of 2 positions shown · non-contrast
Comparison: None.

CLINICAL DATA: Productive cough.  Chest pain.

EXAM:
CHEST - 2 VIEW

[chest pa]
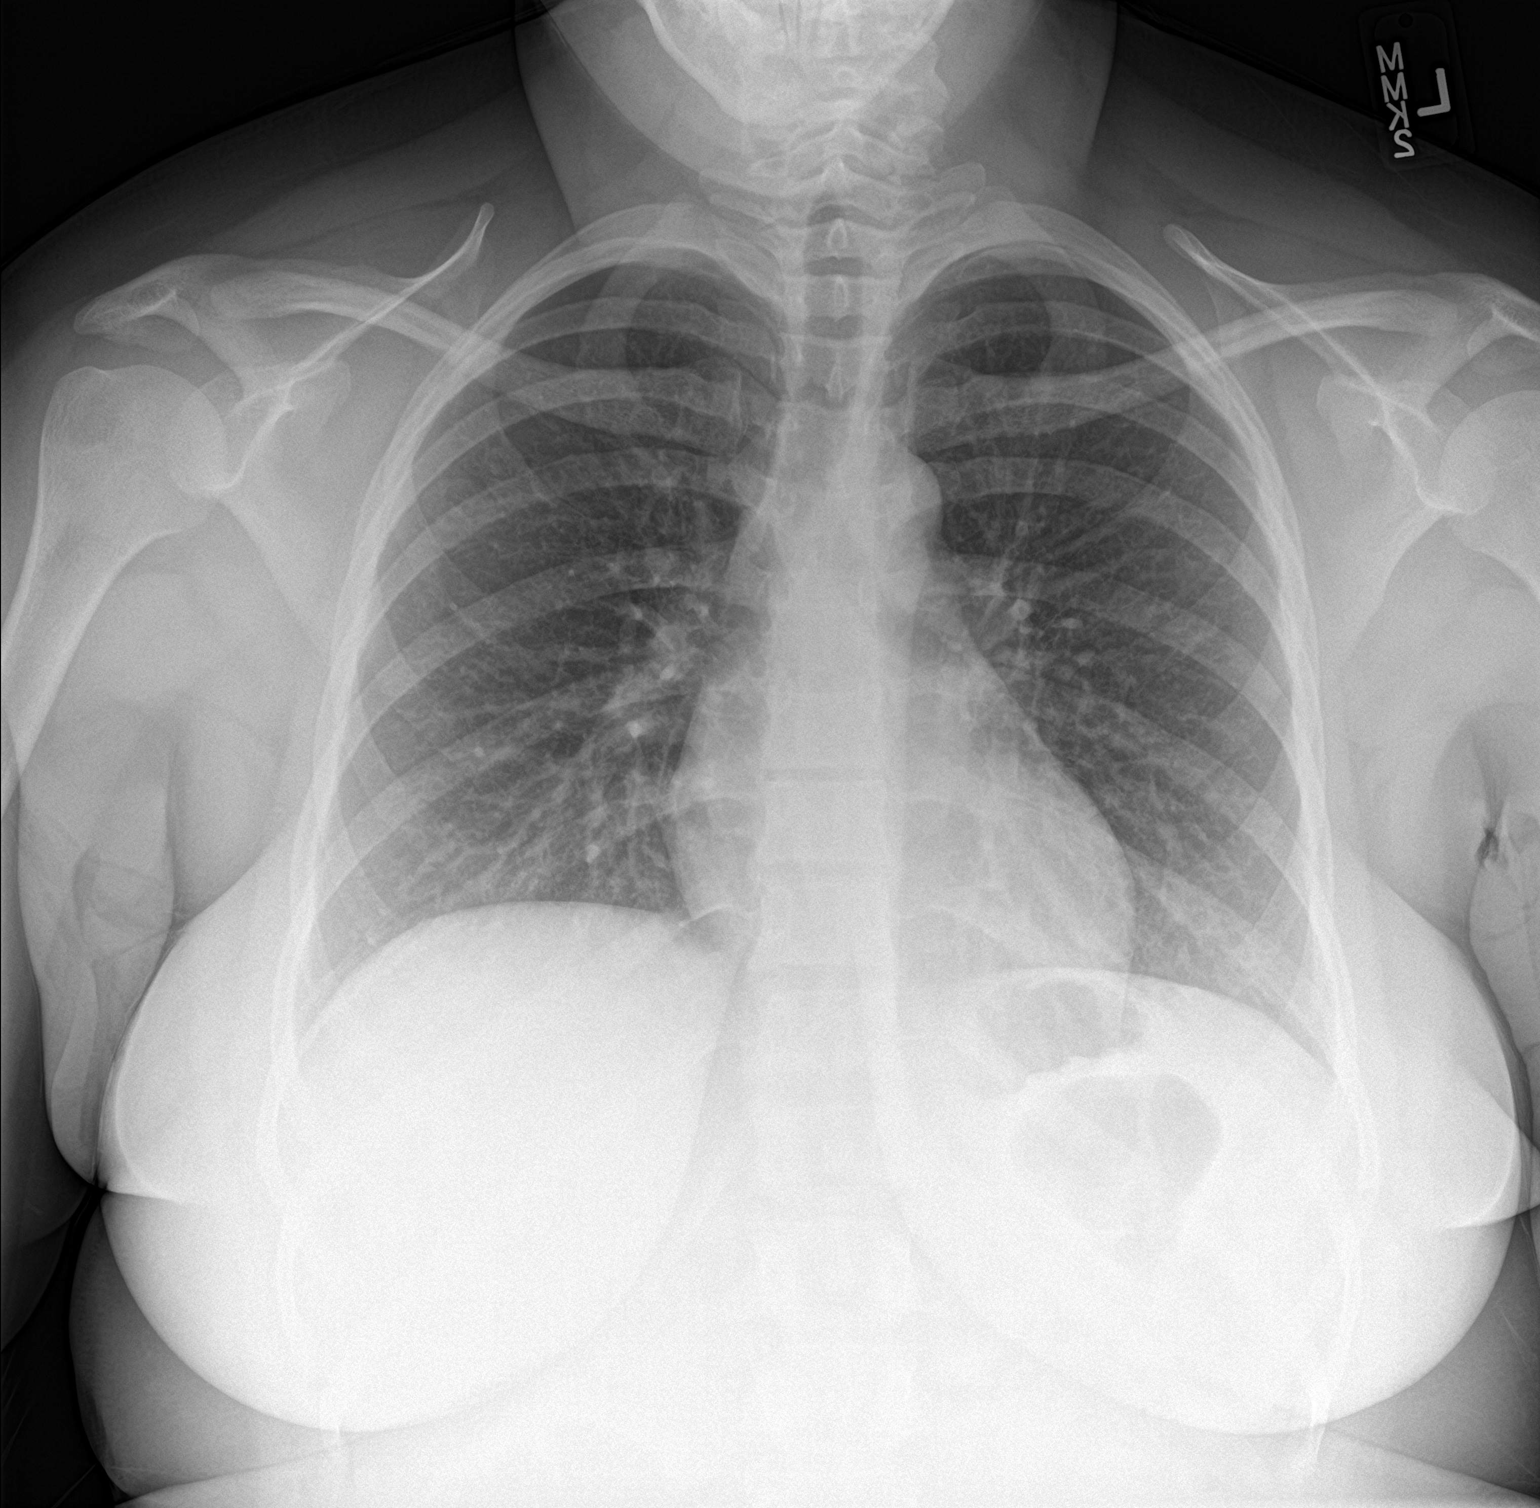

[chest lat]
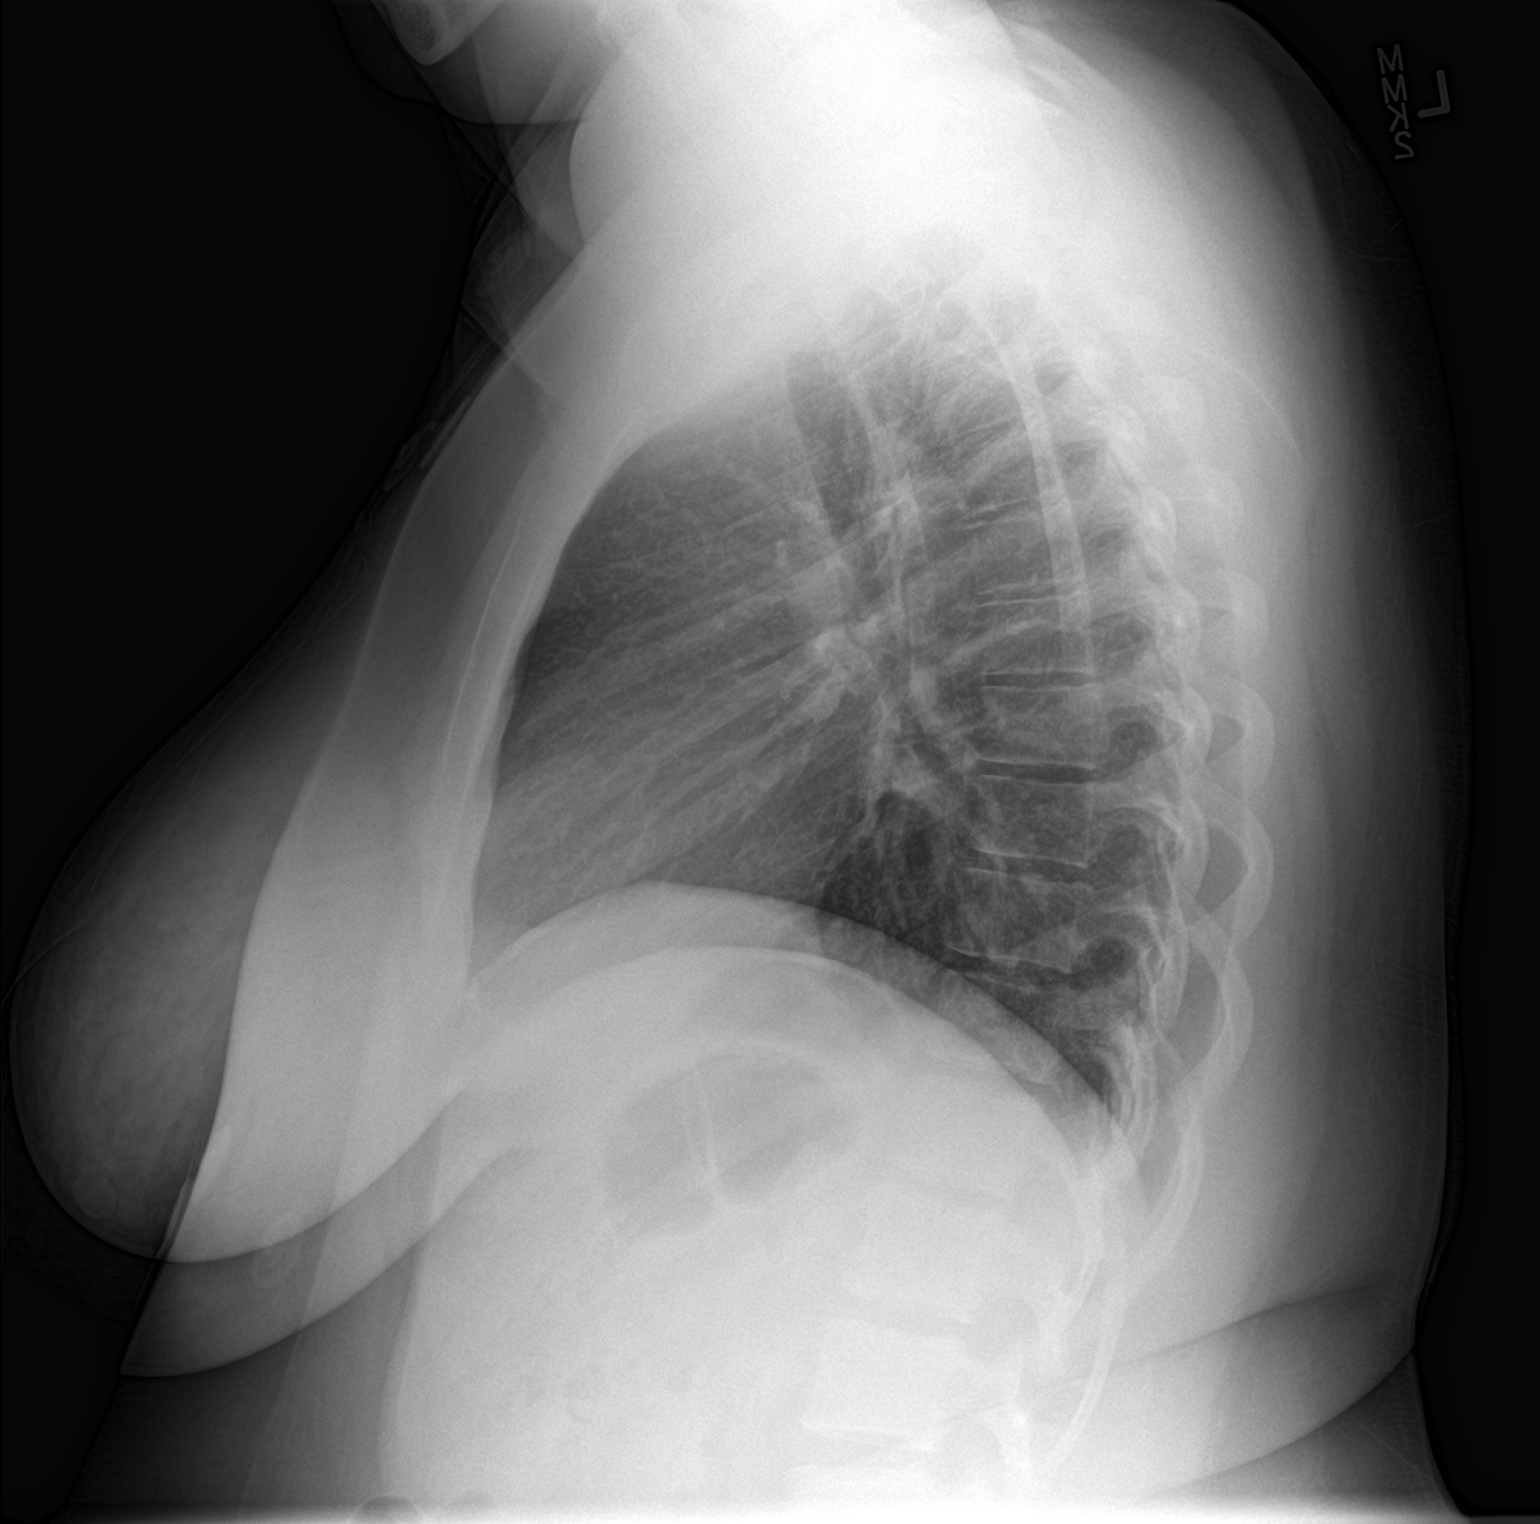

[2 of 2 positions shown; findings below may reference images not displayed]

FINDINGS: The heart size and mediastinal contours are within normal limits.
Both lungs are clear. The visualized skeletal structures are
unremarkable.
IMPRESSION: Negative two view chest x-ray

## 2020-04-14 ENCOUNTER — Encounter (INDEPENDENT_AMBULATORY_CARE_PROVIDER_SITE_OTHER): Payer: Self-pay

## 2020-04-23 ENCOUNTER — Other Ambulatory Visit: Payer: Self-pay

## 2020-04-23 ENCOUNTER — Other Ambulatory Visit: Payer: Medicaid Other

## 2020-04-23 DIAGNOSIS — Z20822 Contact with and (suspected) exposure to covid-19: Secondary | ICD-10-CM

## 2020-04-24 LAB — SARS-COV-2, NAA 2 DAY TAT

## 2020-04-24 LAB — NOVEL CORONAVIRUS, NAA: SARS-CoV-2, NAA: NOT DETECTED

## 2020-09-02 LAB — HM PAP SMEAR
HPV 16/18/45 genotyping: POSITIVE
HPV, high-risk: POSITIVE

## 2021-07-23 ENCOUNTER — Ambulatory Visit
Admission: RE | Admit: 2021-07-23 | Discharge: 2021-07-23 | Disposition: A | Discharge status: Home / Self Care | Payer: Medicaid Other | Source: Ambulatory Visit | Attending: Physician Assistant | Admitting: Physician Assistant

## 2021-07-23 ENCOUNTER — Other Ambulatory Visit: Payer: Self-pay | Admitting: Physician Assistant

## 2021-07-23 DIAGNOSIS — M5440 Lumbago with sciatica, unspecified side: Secondary | ICD-10-CM

## 2022-01-06 ENCOUNTER — Encounter (INDEPENDENT_AMBULATORY_CARE_PROVIDER_SITE_OTHER): Payer: Self-pay

## 2022-05-03 LAB — HM PAP SMEAR: HPV 16/18/45 genotyping: NEGATIVE

## 2022-06-09 ENCOUNTER — Other Ambulatory Visit: Payer: Self-pay

## 2022-06-09 ENCOUNTER — Emergency Department (HOSPITAL_COMMUNITY)
Admission: EM | Admit: 2022-06-09 | Discharge: 2022-06-10 | Disposition: A | Discharge status: Home / Self Care | Payer: 59 | Attending: Emergency Medicine | Admitting: Emergency Medicine

## 2022-06-09 ENCOUNTER — Emergency Department (HOSPITAL_COMMUNITY): Payer: 59

## 2022-06-09 DIAGNOSIS — D1803 Hemangioma of intra-abdominal structures: Secondary | ICD-10-CM | POA: Diagnosis not present

## 2022-06-09 DIAGNOSIS — R091 Pleurisy: Secondary | ICD-10-CM | POA: Diagnosis not present

## 2022-06-09 DIAGNOSIS — J90 Pleural effusion, not elsewhere classified: Secondary | ICD-10-CM | POA: Diagnosis not present

## 2022-06-09 DIAGNOSIS — R079 Chest pain, unspecified: Secondary | ICD-10-CM | POA: Diagnosis present

## 2022-06-09 LAB — CBC
HCT: 39.6 % (ref 36.0–46.0)
Hemoglobin: 12.7 g/dL (ref 12.0–15.0)
MCH: 28.5 pg (ref 26.0–34.0)
MCHC: 32.1 g/dL (ref 30.0–36.0)
MCV: 89 fL (ref 80.0–100.0)
Platelets: 299 10*3/uL (ref 150–400)
RBC: 4.45 MIL/uL (ref 3.87–5.11)
RDW: 15.4 % (ref 11.5–15.5)
WBC: 6.4 10*3/uL (ref 4.0–10.5)
nRBC: 0 % (ref 0.0–0.2)

## 2022-06-09 LAB — BASIC METABOLIC PANEL
Anion gap: 6 (ref 5–15)
BUN: 10 mg/dL (ref 6–20)
CO2: 24 mmol/L (ref 22–32)
Calcium: 8.5 mg/dL — ABNORMAL LOW (ref 8.9–10.3)
Chloride: 105 mmol/L (ref 98–111)
Creatinine, Ser: 1.14 mg/dL — ABNORMAL HIGH (ref 0.44–1.00)
GFR, Estimated: >60 mL/min (ref >60)
Glucose, Bld: 91 mg/dL (ref 70–99)
Potassium: 3.9 mmol/L (ref 3.5–5.1)
Sodium: 135 mmol/L (ref 135–145)

## 2022-06-09 LAB — TROPONIN I (HIGH SENSITIVITY)
Troponin I (High Sensitivity): 3 ng/L (ref <18)
Troponin I (High Sensitivity): 3 ng/L (ref <18)

## 2022-06-09 LAB — I-STAT BETA HCG BLOOD, ED (MC, WL, AP ONLY): I-stat hCG, quantitative: <5 m[IU]/mL (ref <5)

## 2022-06-09 MED ORDER — KETOROLAC TROMETHAMINE 15 MG/ML IJ SOLN
15.0000 mg | Freq: Once | INTRAMUSCULAR | Status: AC
  Administered 2022-06-09: 15 mg via INTRAVENOUS
  Filled 2022-06-09: qty 1

## 2022-06-09 MED ORDER — MORPHINE SULFATE (PF) 4 MG/ML IV SOLN
4.0000 mg | Freq: Once | INTRAVENOUS | Status: AC
  Administered 2022-06-09: 4 mg via INTRAVENOUS
  Filled 2022-06-09: qty 1

## 2022-06-09 NOTE — ED Provider Notes (Signed)
[facility] EMERGENCY DEPARTMENT Provider Note   CSN: [id number] Arrival date & time: 06/09/22  1518     History  Chief Complaint  Patient presents with   Chest Pain    Jillian Mercer is a 37 y.o. female presenting with 2 days worth of pleuritic chest pain.  She says that she is unable to take a full deep breath because of the severe pain.  Describes it as sharp.  No history of PE, recent travel or surgery.  Does report a very sedentary job and a familial history of recurrent PE.  Also has been on OCPs, stopped taking them 6 to 7 days ago.  No history of ACS but does have a history of hyperlipidemia.  No cough or congestion but does report a viral illness with a severe cough 1 to 2 weeks ago.   Chest Pain Associated symptoms: shortness of breath   Associated symptoms: no fever and no palpitations        Home Medications Prior to Admission medications   Medication Sig Start Date End Date Taking? Authorizing Provider  acetaminophen (TYLENOL) 500 MG tablet Take 500-1,000 mg by mouth every 6 (six) hours as needed for mild pain or fever.    Yes [provider]  albuterol (PROAIR HFA) 108 (90 Base) MCG/ACT inhaler Inhale 2 puffs into the lungs every 6 (six) hours as needed for wheezing or shortness of breath.   Yes [provider]  FLUoxetine (PROZAC) 40 MG capsule Take 40 mg by mouth daily.   Yes [provider]  ibuprofen (ADVIL,MOTRIN) 200 MG tablet Take 600 mg by mouth every 6 (six) hours as needed (for fever or pain).   Yes [provider]  ondansetron (ZOFRAN-ODT) 4 MG disintegrating tablet Take 4 mg by mouth every 8 (eight) hours as needed for nausea or vomiting.   Yes [provider]  pantoprazole (PROTONIX) 40 MG tablet Take 1 tablet by mouth daily.   Yes [provider]  QUEtiapine (SEROQUEL) 25 MG tablet Take 25 mg by mouth 2 (two) times daily.   Yes [provider]  rosuvastatin (CRESTOR) 5 MG  tablet Take 5 mg by mouth every evening. 03/25/22  Yes [provider]      Allergies    Penicillins    Review of Systems   Review of Systems  Constitutional:  Negative for chills and fever.  HENT:  Negative for congestion.   Respiratory:  Positive for chest tightness and shortness of breath.   Cardiovascular:  Positive for chest pain. Negative for palpitations.    Physical Exam Updated Vital Signs BP 117/66   Pulse 67   Temp 98.8 F (37.1 C)   Resp 18   SpO2 99%  Physical Exam Vitals and nursing note reviewed.  Constitutional:      General: She is not in acute distress.    Appearance: Normal appearance. She is not ill-appearing.  HENT:     Head: Normocephalic and atraumatic.  Eyes:     General: No scleral icterus.    Conjunctiva/sclera: Conjunctivae normal.  Cardiovascular:     Rate and Rhythm: Normal rate and regular rhythm.  Pulmonary:     Effort: Pulmonary effort is normal. No respiratory distress.     Breath sounds: Normal breath sounds.     Comments: Patient with difficulty with deep inspiration.  Nearly brought to tears due to the pain. Chest:     Chest wall: No tenderness.  Skin:    Findings: No  rash.  Neurological:     Mental Status: She is alert.  Psychiatric:        Mood and Affect: Mood normal.     ED Results / Procedures / Treatments   Labs (all labs ordered are listed, but only abnormal results are displayed) Labs Reviewed  BASIC METABOLIC PANEL - Abnormal; Notable for the following components:      Result Value   Creatinine, Ser 1.14 (*)    Calcium 8.5 (*)    All other components within normal limits  CBC  I-STAT BETA HCG BLOOD, ED ([facility], [facility], [facility] ONLY)  TROPONIN I (HIGH SENSITIVITY)  TROPONIN I (HIGH SENSITIVITY)    EKG EKG Interpretation  Date/Time:  Wednesday June 09 2022 16:14:18 EST Ventricular Rate:  65 PR Interval:  198 QRS Duration: 82 QT Interval:  420 QTC Calculation: 436 R Axis:   54 Text  Interpretation: Interpretation limited secondary to artifact Normal sinus rhythm Low voltage QRS Cannot rule out Anterior infarct , age undetermined Abnormal ECG When compared with ECG of 06-Aug-2018 13:06, PREVIOUS ECG IS PRESENT Confirmed by Margaretmary Eddy [phone]) on 06/09/2022 10:05:23 PM  Radiology DG Chest 2 View  Result Date: 06/09/2022 CLINICAL DATA:  Chest pain since yesterday. EXAM: CHEST - 2 VIEW COMPARISON:  08/06/2018 x-ray FINDINGS: No consolidation, pneumothorax or effusion. Normal cardiopericardial silhouette. Mild peribronchial thickening and interstitial changes new from previous. IMPRESSION: Mild peribronchial thickening and interstitial changes new from prior. Acute process is possible. Recommend follow-up Electronically Signed   By: Jill Side M.D.   On: 06/09/2022 16:58    Procedures Procedures   Medications Ordered in ED Medications  ketorolac (TORADOL) 15 MG/ML injection 15 mg (has no administration in time range)    ED Course/ Medical Decision Making/ A&P                           Medical Decision Making Amount and/or Complexity of Data Reviewed Labs: ordered. Radiology: ordered.  Risk Prescription drug management.   37 year old female presenting today with pleuritic chest pain. DDX includes PE, ACS , pleurisy, pericarditis, aortic dissection,  PNA and PTX.   This is not an exhaustive differential.      Physical Exam: Pertinent physical exam findings include Clear pain with deep inspiration but Ctab  Lab Tests: I ordered, and personally interpreted labs.  The pertinent results include: Negative troponin x2    Imaging Studies: Chest x-ray ordered in triage.  Viewed and interpreted this time and agree that there are are no acute findings.  Question some bronchitis.  CT scan ordered and pending at this time.    Medications: Ordered toradol.  Reevaluated after the Toradol and says that she had mild relief but would like to try something else.   Morphine ordered at this time.  MDM/Disposition: This is a 37 year old female who presented today with pleuritic CP. ACS work-up negative however due to risk factors and presentation, CTPA was ordered to evaluate for PE.  This study is pending at this time.  At this time patient has been signed out to overnight PA Lorre Munroe, please see his note for ultimate results of the scan and disposition. I suspect patient will be discharged home with a diagnosis of bronchitis/pleurisy from recent viral illness with a negative CT. If positive, overnight providers will decide discharge with blood thinners vs. Admission.    Final Clinical Impression(s) / ED Diagnoses Final diagnoses:  None    Rx /  DC Orders ED Discharge Orders     None         Derionna Salvador, Kelby Aline 06/09/22 2336    Fransico Meadow, MD 06/10/22 1017

## 2022-06-09 NOTE — ED Triage Notes (Signed)
Pt with CP since yesterday. No cardiac history.  Radiating to left upper arm.  No n/v/d or shob.  Worse with deep breath or lying down

## 2022-06-09 NOTE — ED Provider Triage Note (Signed)
Emergency Medicine Provider Triage Evaluation Note  Jillian Mercer , a 37 y.o. female  was evaluated in triage.  Pt complains of chest pain that radiates down the left arm that started yesterday.  No cardiac history.  Denies nausea, vomiting, and shortness of breath.  Pain worse with deep inspiration or lying down.  Recently stopped birth control 1 week ago.  No recent long travel.  Review of Systems  Positive: CP Negative: Abdominal pain  Physical Exam  BP 117/75 (BP Location: Right Arm)   Pulse 67   Temp 98.4 F (36.9 C) (Oral)   Resp 18   SpO2 99%  Gen:   Awake, no distress   Resp:  Normal effort  MSK:   Moves extremities without difficulty  Other:    Medical Decision Making  Medically screening exam initiated at 4:11 PM.  Appropriate orders placed.  Jillian Mercer was informed that the remainder of the evaluation will be completed by another provider, this initial triage assessment does not replace that evaluation, and the importance of remaining in the ED until their evaluation is complete.  Cardiac labs   Jillian Mercer 06/09/22 [id number]

## 2022-06-10 ENCOUNTER — Emergency Department (HOSPITAL_COMMUNITY): Payer: 59

## 2022-06-10 DIAGNOSIS — R091 Pleurisy: Secondary | ICD-10-CM | POA: Diagnosis not present

## 2022-06-10 MED ORDER — IBUPROFEN 800 MG PO TABS: 800.0000 mg | ORAL_TABLET | Freq: Three times a day (TID) | ORAL | 0 refills | Status: AC

## 2022-06-10 MED ORDER — IOHEXOL 350 MG/ML SOLN
50.0000 mL | Freq: Once | INTRAVENOUS | Status: AC | PRN
  Administered 2022-06-10: 50 mL via INTRAVENOUS

## 2022-06-10 MED ORDER — ALBUTEROL SULFATE HFA 108 (90 BASE) MCG/ACT IN AERS
2.0000 | INHALATION_SPRAY | RESPIRATORY_TRACT | Status: DC | PRN
  Administered 2022-06-10: 2 via RESPIRATORY_TRACT
  Filled 2022-06-10: qty 6.7

## 2022-06-10 NOTE — Discharge Instructions (Addendum)
You had very small pleural effusions. Your symptoms are thought to be from pleurisy. I recommend ibuprofen '800mg'$  for the next week.  If you develop fever or worsening symptoms, please return.  Follow-up with your doctor and discuss your results with them.

## 2022-06-10 NOTE — ED Provider Notes (Signed)
CT notable for no PE.  Small pelural effusions.  Symptoms thought 2/2 pleurisy.  No sign of infection.  Will treat with NSAIDs.  Discuss CT results with patient. She appears stable for discharge.   Montine Circle, PA-C 06/10/22 0151    Palumbo, April, MD 06/10/22 [id number]

## 2022-06-10 NOTE — ED Notes (Signed)
Patient verbalizes understanding of d/c instructions. Opportunities for questions and answers were provided. Pt d/c from ED and ambulated to lobby with husband.  

## 2022-12-09 ENCOUNTER — Ambulatory Visit (INDEPENDENT_AMBULATORY_CARE_PROVIDER_SITE_OTHER): Payer: 59 | Admitting: Family Medicine

## 2022-12-09 ENCOUNTER — Encounter (HOSPITAL_BASED_OUTPATIENT_CLINIC_OR_DEPARTMENT_OTHER): Payer: Self-pay | Admitting: Family Medicine

## 2022-12-09 VITALS — BP 135/80 | HR 82 | Ht 65.0 in | Wt 279.0 lb

## 2022-12-09 DIAGNOSIS — R42 Dizziness and giddiness: Secondary | ICD-10-CM

## 2022-12-09 DIAGNOSIS — N939 Abnormal uterine and vaginal bleeding, unspecified: Secondary | ICD-10-CM | POA: Diagnosis not present

## 2022-12-09 DIAGNOSIS — Z859 Personal history of malignant neoplasm, unspecified: Secondary | ICD-10-CM

## 2022-12-09 DIAGNOSIS — Z809 Family history of malignant neoplasm, unspecified: Secondary | ICD-10-CM

## 2022-12-09 DIAGNOSIS — M545 Low back pain, unspecified: Secondary | ICD-10-CM

## 2022-12-09 DIAGNOSIS — Z7689 Persons encountering health services in other specified circumstances: Secondary | ICD-10-CM

## 2022-12-09 DIAGNOSIS — G8929 Other chronic pain: Secondary | ICD-10-CM

## 2022-12-09 MED ORDER — ROSUVASTATIN CALCIUM 5 MG PO TABS: 5.0000 mg | ORAL_TABLET | Freq: Every evening | ORAL | 1 refills | Status: DC

## 2022-12-09 MED ORDER — MECLIZINE HCL 12.5 MG PO TABS: 12.5000 mg | ORAL_TABLET | Freq: Three times a day (TID) | ORAL | 0 refills | Status: AC | PRN
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
## 2022-12-10 LAB — COMPREHENSIVE METABOLIC PANEL
ALT: 11 IU/L (ref 0–32)
AST: 16 IU/L (ref 0–40)
Albumin: 4.1 g/dL (ref 3.9–4.9)
Alkaline Phosphatase: 112 IU/L (ref 44–121)
BUN/Creatinine Ratio: 9 (ref 9–23)
BUN: 10 mg/dL (ref 6–20)
Bilirubin Total: 0.3 mg/dL (ref 0.0–1.2)
CO2: 22 mmol/L (ref 20–29)
Calcium: 8.9 mg/dL (ref 8.7–10.2)
Chloride: 106 mmol/L (ref 96–106)
Creatinine, Ser: 1.12 mg/dL — ABNORMAL HIGH (ref 0.57–1.00)
Globulin, Total: 2.6 g/dL (ref 1.5–4.5)
Glucose: 87 mg/dL (ref 70–99)
Potassium: 4.5 mmol/L (ref 3.5–5.2)
Sodium: 140 mmol/L (ref 134–144)
Total Protein: 6.7 g/dL (ref 6.0–8.5)
eGFR: 66 mL/min/{1.73_m2} (ref >59)

## 2022-12-10 LAB — LIPID PANEL
Chol/HDL Ratio: 6 ratio — ABNORMAL HIGH (ref 0.0–4.4)
Cholesterol, Total: 193 mg/dL (ref 100–199)
HDL: 32 mg/dL — ABNORMAL LOW (ref >39)
LDL Chol Calc (NIH): 144 mg/dL — ABNORMAL HIGH (ref 0–99)
Triglycerides: 93 mg/dL (ref 0–149)
VLDL Cholesterol Cal: 17 mg/dL (ref 5–40)

## 2022-12-10 LAB — CBC WITH DIFFERENTIAL/PLATELET
Basophils Absolute: 0 10*3/uL (ref 0.0–0.2)
Basos: 1 %
EOS (ABSOLUTE): 0.2 10*3/uL (ref 0.0–0.4)
Eos: 3 %
Hematocrit: 42.1 % (ref 34.0–46.6)
Hemoglobin: 12.9 g/dL (ref 11.1–15.9)
Immature Grans (Abs): 0 10*3/uL (ref 0.0–0.1)
Immature Granulocytes: 0 %
Lymphocytes Absolute: 1.2 10*3/uL (ref 0.7–3.1)
Lymphs: 16 %
MCH: 26.4 pg — ABNORMAL LOW (ref 26.6–33.0)
MCHC: 30.6 g/dL — ABNORMAL LOW (ref 31.5–35.7)
MCV: 86 fL (ref 79–97)
Monocytes Absolute: 0.6 10*3/uL (ref 0.1–0.9)
Monocytes: 8 %
Neutrophils Absolute: 5.3 10*3/uL (ref 1.4–7.0)
Neutrophils: 72 %
Platelets: 368 10*3/uL (ref 150–450)
RBC: 4.88 x10E6/uL (ref 3.77–5.28)
RDW: 14.4 % (ref 11.7–15.4)
WBC: 7.2 10*3/uL (ref 3.4–10.8)

## 2022-12-10 LAB — TSH RFX ON ABNORMAL TO FREE T4: TSH: 1.47 u[IU]/mL (ref 0.450–4.500)

## 2022-12-10 LAB — HEMOGLOBIN A1C
Est. average glucose Bld gHb Est-mCnc: 105 mg/dL
Hgb A1c MFr Bld: 5.3 % (ref 4.8–5.6)

## 2022-12-12 ENCOUNTER — Encounter (HOSPITAL_BASED_OUTPATIENT_CLINIC_OR_DEPARTMENT_OTHER): Payer: Self-pay | Admitting: Family Medicine

## 2022-12-12 DIAGNOSIS — N939 Abnormal uterine and vaginal bleeding, unspecified: Secondary | ICD-10-CM | POA: Insufficient documentation

## 2022-12-12 DIAGNOSIS — R42 Dizziness and giddiness: Secondary | ICD-10-CM | POA: Insufficient documentation

## 2022-12-12 DIAGNOSIS — G8929 Other chronic pain: Secondary | ICD-10-CM | POA: Insufficient documentation

## 2022-12-13 ENCOUNTER — Telehealth: Payer: Self-pay | Admitting: Genetic Counselor

## 2022-12-13 NOTE — Telephone Encounter (Signed)
Left patient message regarding appointment times/date

## 2023-01-06 ENCOUNTER — Telehealth (INDEPENDENT_AMBULATORY_CARE_PROVIDER_SITE_OTHER): Payer: 59 | Admitting: Family Medicine

## 2023-01-06 ENCOUNTER — Telehealth: Payer: 59 | Admitting: Dermatology

## 2023-01-06 DIAGNOSIS — G8929 Other chronic pain: Secondary | ICD-10-CM

## 2023-01-06 DIAGNOSIS — M545 Low back pain, unspecified: Secondary | ICD-10-CM

## 2023-01-06 NOTE — Progress Notes (Signed)
Virtual Visit via Video Note  I connected with Lilla Shook on 01/06/23 by a video enabled telemedicine application and verified that I am speaking with the correct person using two identifiers.  Patient Location: Home Provider Location: Office/Clinic  I discussed the limitations, risks, security, and privacy concerns of performing an evaluation and management service by video and the availability of in person appointments. I also discussed with the patient that there may be a patient responsible charge related to this service. The patient expressed understanding and agreed to proceed.  Subjective: PCP: Alyson Reedy, FNP  Jillian Mercer is a 37 year-old female patient who presents virtually for weight loss management and concerns regarding her continued back pain.   She is currently taking Ozempic 0.25mg  weekly- prescribed by another provider (not myself). Recent A1c 5.3. She is starting on 0.5mg  dose this week. Reports she has tolerated the 0.25mg  dose without significant side effects.   Back pain-- continues to bother her  Feels like pressure "from when you get an epidural" in her lumbar region  Radiated down left leg- shooting pain  Tried to avoid walking yesterday due to muscle spasms- did not notice difference with walking or with rest. Pain is the same.  Was going to take tizanidine but wanted to take seroquel in the evening  Denies numbness/tingling down leg, changes in bowel/bladder function, saddle anesthesia. Spine xray in the past- 07/23/2021 Underwent PT in 2023 for a couple of months   ROS: Per HPI  Current Outpatient Medications:    acetaminophen (TYLENOL) 500 MG tablet, Take 500-1,000 mg by mouth every 6 (six) hours as needed for mild pain or fever. , Disp: , Rfl:    albuterol (PROAIR HFA) 108 (90 Base) MCG/ACT inhaler, Inhale 2 puffs into the lungs every 6 (six) hours as needed for wheezing or shortness of breath., Disp: , Rfl:    FLUoxetine (PROZAC) 40 MG  capsule, Take 40 mg by mouth daily., Disp: , Rfl:    ibuprofen (ADVIL) 800 MG tablet, Take 1 tablet (800 mg total) by mouth 3 (three) times daily., Disp: 21 tablet, Rfl: 0   meclizine (ANTIVERT) 12.5 MG tablet, Take 1 tablet (12.5 mg total) by mouth 3 (three) times daily as needed for dizziness., Disp: 30 tablet, Rfl: 0   ondansetron (ZOFRAN-ODT) 4 MG disintegrating tablet, Take 4 mg by mouth every 8 (eight) hours as needed for nausea or vomiting., Disp: , Rfl:    OZEMPIC, 0.25 OR 0.5 MG/DOSE, 2 MG/3ML SOPN, Inject into the skin., Disp: , Rfl:    pantoprazole (PROTONIX) 40 MG tablet, Take 1 tablet by mouth daily., Disp: , Rfl:    QUEtiapine (SEROQUEL) 25 MG tablet, Take 25 mg by mouth 2 (two) times daily., Disp: , Rfl:    rosuvastatin (CRESTOR) 5 MG tablet, Take 1 tablet (5 mg total) by mouth every evening., Disp: 30 tablet, Rfl: 1   tiZANidine (ZANAFLEX) 4 MG tablet, TAKE 1 TABLET BY MOUTH EVERY 6 HOURS AS NEEDED FOR 30 DAYS, Disp: , Rfl:   Observations/Objective: There were no vitals filed for this visit.   General: Alert and oriented x 4. Speaking in clear and full sentences, no audible heavy breathing, no acute distress.  Sounds alert and appropriately interactive.  Appears well.  Face symmetric.  Extraocular movements intact.  Pupils equal and round.  No nasal flaring or accessory muscle use visualized.  Assessment and Plan: 1. Chronic midline low back pain, unspecified whether sciatica present Patient presents today for concerns regarding low  back pain with pain shooting down her left leg. Denies saddle anesthesia, bowel or bladder incontinence, numbness/tingling down her lower extremity, fever/chills, weakness. Spinal x-ray completed on 07/23/2021 with no abnormalities noted. Reasonable to obtain MRI based on duration of symptoms and no improvement with conservative management. Plan of care based on MRI results.  - MR LUMBAR SPINE WO CONTRAST  2. Morbid obesity due to excess calories  [facility]) Patient reports she is increasing her Ozempic from 0.25mg  weekly to 0.5mg  weekly this week. Hemoglobin A1c 5.3 at last visit. She is receiving medication from another provider for weight loss management. Counseled patient that we are able to manage medication regimen if she prefers.    Follow Up Instructions: Return in about 3 months (around 04/08/2023) for back pain .   I discussed the assessment and treatment plan with the patient. The patient was provided an opportunity to ask questions, and all were answered. The patient agreed with the plan and demonstrated an understanding of the instructions.   The patient was advised to call back or seek an in-person evaluation if the symptoms worsen or if the condition fails to improve as anticipated.  The above assessment and management plan was discussed with the patient. The patient verbalized understanding of and has agreed to the management plan.   Alyson Reedy, FNP

## 2023-01-11 ENCOUNTER — Encounter (HOSPITAL_BASED_OUTPATIENT_CLINIC_OR_DEPARTMENT_OTHER): Payer: Self-pay | Admitting: Family Medicine

## 2023-01-11 ENCOUNTER — Inpatient Hospital Stay: Payer: 59 | Attending: Genetic Counselor | Admitting: Genetic Counselor

## 2023-01-11 ENCOUNTER — Inpatient Hospital Stay: Payer: 59

## 2023-01-12 ENCOUNTER — Telehealth (INDEPENDENT_AMBULATORY_CARE_PROVIDER_SITE_OTHER): Payer: 59 | Admitting: Family Medicine

## 2023-01-12 ENCOUNTER — Encounter (HOSPITAL_BASED_OUTPATIENT_CLINIC_OR_DEPARTMENT_OTHER): Payer: Self-pay | Admitting: Family Medicine

## 2023-01-12 VITALS — Ht 65.0 in | Wt 277.0 lb

## 2023-01-12 DIAGNOSIS — J069 Acute upper respiratory infection, unspecified: Secondary | ICD-10-CM | POA: Diagnosis not present

## 2023-01-12 NOTE — Progress Notes (Signed)
Virtual Visit via Video Note  I connected with Jillian Mercer on 01/12/23 at 12:02 PM by a video enabled telemedicine application and verified that I am speaking with the correct person using two identifiers.  Patient Location: Home Provider Location: Office/Clinic  I discussed the limitations, risks, security, and privacy concerns of performing an evaluation and management service by video and the availability of in person appointments. I also discussed with the patient that there may be a patient responsible charge related to this service. The patient expressed understanding and agreed to proceed.  Subjective: PCP: Alyson Reedy, FNP  Chief Complaint  Patient presents with   Sore Throat    Spoke with pt who states she has been having problems with a sore throat, sinus pressure, cough, runny nose, and fatigue. Pt did take a covid test yesterday 8/13 which was negative. Denies any complaints of fever but states that she is always cold.   Jillian Mercer is a 37 year-old female patient who presents today for sore throat, which has progressed into generalized soreness, sinus pressure, nausea, loss of appetite, cough, rhinorrhea, chills, and fatigue. Went to a birthday party on Saturday and symptoms presented on Tuesday. She reports someone else at the party has similar to symptoms. Home covid test was negative. Denies fever, shortness of breath, wheezing, chest pain., cough.    ROS: Per HPI  Current Outpatient Medications:    acetaminophen (TYLENOL) 500 MG tablet, Take 500-1,000 mg by mouth every 6 (six) hours as needed for mild pain or fever. , Disp: , Rfl:    albuterol (PROAIR HFA) 108 (90 Base) MCG/ACT inhaler, Inhale 2 puffs into the lungs every 6 (six) hours as needed for wheezing or shortness of breath., Disp: , Rfl:    FLUoxetine (PROZAC) 40 MG capsule, Take 40 mg by mouth daily., Disp: , Rfl:    ibuprofen (ADVIL) 800 MG tablet, Take 1 tablet (800 mg total) by mouth 3 (three) times  daily., Disp: 21 tablet, Rfl: 0   meclizine (ANTIVERT) 12.5 MG tablet, Take 1 tablet (12.5 mg total) by mouth 3 (three) times daily as needed for dizziness., Disp: 30 tablet, Rfl: 0   ondansetron (ZOFRAN-ODT) 4 MG disintegrating tablet, Take 4 mg by mouth every 8 (eight) hours as needed for nausea or vomiting., Disp: , Rfl:    OZEMPIC, 0.25 OR 0.5 MG/DOSE, 2 MG/3ML SOPN, Inject into the skin., Disp: , Rfl:    pantoprazole (PROTONIX) 40 MG tablet, Take 1 tablet by mouth daily., Disp: , Rfl:    QUEtiapine (SEROQUEL) 25 MG tablet, Take 25 mg by mouth 2 (two) times daily., Disp: , Rfl:    tiZANidine (ZANAFLEX) 4 MG tablet, TAKE 1 TABLET BY MOUTH EVERY 6 HOURS AS NEEDED FOR 30 DAYS, Disp: , Rfl:   Observations/Objective: Today's Vitals   01/12/23 1100  Weight: 277 lb (125.6 kg)  Height: 5\' 5"  (1.651 m)    General: Alert and oriented x 4. Speaking in clear and full sentences, no audible heavy breathing, no acute distress.  Sounds alert and appropriately interactive.  Appears well.  Face symmetric.  Extraocular movements intact.  Pupils equal and round.  No nasal flaring or accessory muscle use visualized.  Assessment and Plan: 1. Viral URI You have a viral infection that will resolve on its own over time.  Symptomatic treatment is ideal at this time. Symptoms will last 3-7 days but can stretch out to 10-14 days. You can use nasal saline to help with nasal drainage/congestion. Also reasonable  to to use an over the counter Tylenol and/or Advil to help with cough, pain, and fevers/chills. Warm fluids with honey are also helpful. Humidification may be beneficial.  Unfortunately, antibiotics are not helpful for viral infections. Drink plenty of fluids and stay hydrated. Advised patient to call office if no improvement by next Monday 8/19 days as this may have progressed to a bacterial infection.    Follow Up Instructions: Return if symptoms worsen or fail to improve.   I discussed the assessment  and treatment plan with the patient. The patient was provided an opportunity to ask questions, and all were answered. The patient agreed with the plan and demonstrated an understanding of the instructions.   The patient was advised to call back or seek an in-person evaluation if the symptoms worsen or if the condition fails to improve as anticipated.  The above assessment and management plan was discussed with the patient. The patient verbalized understanding of and has agreed to the management plan.   Alyson Reedy, FNP

## 2023-01-13 ENCOUNTER — Telehealth (HOSPITAL_BASED_OUTPATIENT_CLINIC_OR_DEPARTMENT_OTHER): Payer: Self-pay | Admitting: Family Medicine

## 2023-01-13 ENCOUNTER — Other Ambulatory Visit: Payer: 59

## 2023-01-13 ENCOUNTER — Encounter (HOSPITAL_BASED_OUTPATIENT_CLINIC_OR_DEPARTMENT_OTHER): Payer: Self-pay | Admitting: Family Medicine

## 2023-01-13 NOTE — Telephone Encounter (Signed)
Sent MyChart message to PCP.

## 2023-01-13 NOTE — Telephone Encounter (Signed)
Pt is calling she took  covid test last night and it is Positive --she is calling to advised, I asked her to send in a picture of the test via mychart

## 2023-02-03 ENCOUNTER — Encounter (HOSPITAL_BASED_OUTPATIENT_CLINIC_OR_DEPARTMENT_OTHER): Payer: Self-pay | Admitting: Family Medicine

## 2023-02-07 ENCOUNTER — Telehealth (HOSPITAL_BASED_OUTPATIENT_CLINIC_OR_DEPARTMENT_OTHER): Payer: 59 | Admitting: Family Medicine

## 2023-02-09 ENCOUNTER — Encounter (HOSPITAL_BASED_OUTPATIENT_CLINIC_OR_DEPARTMENT_OTHER): Payer: Self-pay | Admitting: Family Medicine

## 2023-02-09 ENCOUNTER — Telehealth (INDEPENDENT_AMBULATORY_CARE_PROVIDER_SITE_OTHER): Payer: Medicaid Other | Admitting: Family Medicine

## 2023-02-09 VITALS — Ht 65.0 in | Wt 270.0 lb

## 2023-02-09 DIAGNOSIS — N921 Excessive and frequent menstruation with irregular cycle: Secondary | ICD-10-CM | POA: Diagnosis not present

## 2023-02-09 MED ORDER — TRANEXAMIC ACID 650 MG PO TABS: 1300.0000 mg | ORAL_TABLET | Freq: Three times a day (TID) | ORAL | 0 refills | Status: DC

## 2023-02-09 MED ORDER — QUETIAPINE FUMARATE 25 MG PO TABS: 25.0000 mg | ORAL_TABLET | Freq: Two times a day (BID) | ORAL | 2 refills | Status: DC

## 2023-02-09 MED ORDER — FLUOXETINE HCL 40 MG PO CAPS: 40.0000 mg | ORAL_CAPSULE | Freq: Every day | ORAL | 2 refills | Status: DC

## 2023-02-09 NOTE — Progress Notes (Signed)
Virtual Visit via Video Note  I connected with Jillian Mercer on 02/09/23 at 1:53 PM by a video enabled telemedicine application and verified that I am speaking with the correct person using two identifiers.  Patient Location: Home Provider Location: Office/Clinic  I discussed the limitations, risks, security, and privacy concerns of performing an evaluation and management service by video and the availability of in person appointments. I also discussed with the patient that there may be a patient responsible charge related to this service. The patient expressed understanding and agreed to proceed.  Subjective: PCP: Alyson Reedy, FNP  Jillian Mercer is a 37 year-old female patient who presents on this virtual visit today for concerns regarding ongoing menstrual cycle.   LMP start day: 8/29-9/11 still spotting. Prior to this, previous menstrual cycle was a week before.  Mirena IUD- has had for about 2 years   8/29-9/9: heavy bleeding that entire time, needs tampon & panty liner, needs to change "frequently" about every 2-3 hours during the day.  Cramping and pain with menstrual cycle. Avoids sexual intercourse due to heavy cycles.  She does notice that she would have trouble with shob and dizziness. Reports history of IDA. She has Mirena IUD for the past 2 years. She reports her menstrual cycles used to be a lot worse before getting her IUD, but now she is suffering from heavy, irregular menstrual cycles.   ROS: Per HPI  Current Outpatient Medications:    acetaminophen (TYLENOL) 500 MG tablet, Take 500-1,000 mg by mouth every 6 (six) hours as needed for mild pain or fever. , Disp: , Rfl:    albuterol (PROAIR HFA) 108 (90 Base) MCG/ACT inhaler, Inhale 2 puffs into the lungs every 6 (six) hours as needed for wheezing or shortness of breath., Disp: , Rfl:    ibuprofen (ADVIL) 800 MG tablet, Take 1 tablet (800 mg total) by mouth 3 (three) times daily., Disp: 21 tablet, Rfl: 0   meclizine  (ANTIVERT) 12.5 MG tablet, Take 1 tablet (12.5 mg total) by mouth 3 (three) times daily as needed for dizziness., Disp: 30 tablet, Rfl: 0   ondansetron (ZOFRAN-ODT) 4 MG disintegrating tablet, Take 4 mg by mouth every 8 (eight) hours as needed for nausea or vomiting., Disp: , Rfl:    OZEMPIC, 0.25 OR 0.5 MG/DOSE, 2 MG/3ML SOPN, Inject into the skin., Disp: , Rfl:    pantoprazole (PROTONIX) 40 MG tablet, Take 1 tablet by mouth daily., Disp: , Rfl:    tiZANidine (ZANAFLEX) 4 MG tablet, TAKE 1 TABLET BY MOUTH EVERY 6 HOURS AS NEEDED FOR 30 DAYS, Disp: , Rfl:    FLUoxetine (PROZAC) 40 MG capsule, Take 1 capsule (40 mg total) by mouth daily., Disp: 30 capsule, Rfl: 2   QUEtiapine (SEROQUEL) 25 MG tablet, Take 1 tablet (25 mg total) by mouth 2 (two) times daily., Disp: 30 tablet, Rfl: 2   tranexamic acid (LYSTEDA) 650 MG TABS tablet, Take 2 tablets (1,300 mg total) by mouth 3 (three) times daily. Take three times daily for up to 5 days during monthly menstruation., Disp: 30 tablet, Rfl: 0  Observations/Objective: Today's Vitals   02/09/23 1149  Weight: 270 lb (122.5 kg)  Height: 5\' 5"  (1.651 m)    General: Alert and oriented x 4. Speaking in clear and full sentences, no audible heavy breathing, no acute distress.  Sounds alert and appropriately interactive.  Appears well.  Face symmetric.  Extraocular movements intact.  Pupils equal and round.  No nasal flaring or accessory  muscle use visualized.  Assessment and Plan: 1. Menorrhagia with irregular cycle Patient is a pleasant 37 year-old female patient who presents today with concerns for menorrhagia with irregular cycles. She reports that she experiences frequent menstrual cycles and does not experience a long period of time between them. She reports her current cycle has been 8/29 until today. She reports that she has been bleeding heavily up until 9/9. She reports feeling occasionally lightheaded/dizzy and sometimes feels short of breath. Discussed  limits due to virtual visit, however, patient is in no acute distress and is breathing normally. Will obtain records from [facility]. Recommended patient to schedule appointment with them, since she is interested in hysterectomy. Counseled patient about obtaining CBC and iron studies due to her symptoms and prolonged bleeding, but she prefers to wait until she sees OBGYN. Discussed use of Lysteda three times daily for up to 5 days during monthly menstruation. This may be difficult with her infrequent cycles, but advised patient to take it when her menstrual cycle is the heaviest and for up to 5 days. No contraindications to starting medication- no history of DVTs/PEs. Advised patient to schedule an appointment sooner if she is unable to get an appointment with [facility].  - tranexamic acid (LYSTEDA) 650 MG TABS tablet; Take 2 tablets (1,300 mg total) by mouth 3 (three) times daily.  Dispense: 30 tablet; Refill: 0  Follow Up Instructions: Return if symptoms worsen or fail to improve.   I discussed the assessment and treatment plan with the patient. The patient was provided an opportunity to ask questions, and all were answered. The patient agreed with the plan and demonstrated an understanding of the instructions.   The patient was advised to call back or seek an in-person evaluation if the symptoms worsen or if the condition fails to improve as anticipated.  The above assessment and management plan was discussed with the patient. The patient verbalized understanding of and has agreed to the management plan.   Alyson Reedy, FNP

## 2023-03-05 ENCOUNTER — Encounter (HOSPITAL_BASED_OUTPATIENT_CLINIC_OR_DEPARTMENT_OTHER): Payer: Self-pay | Admitting: Family Medicine

## 2023-03-08 ENCOUNTER — Other Ambulatory Visit (HOSPITAL_BASED_OUTPATIENT_CLINIC_OR_DEPARTMENT_OTHER): Payer: Self-pay | Admitting: Family Medicine

## 2023-03-08 MED ORDER — QUETIAPINE FUMARATE 25 MG PO TABS: 25.0000 mg | ORAL_TABLET | Freq: Two times a day (BID) | ORAL | 2 refills | Status: DC

## 2023-03-14 ENCOUNTER — Encounter (HOSPITAL_BASED_OUTPATIENT_CLINIC_OR_DEPARTMENT_OTHER): Payer: Self-pay | Admitting: Family Medicine

## 2023-03-31 ENCOUNTER — Other Ambulatory Visit (HOSPITAL_BASED_OUTPATIENT_CLINIC_OR_DEPARTMENT_OTHER): Payer: Self-pay | Admitting: Family Medicine

## 2023-04-21 ENCOUNTER — Encounter (HOSPITAL_BASED_OUTPATIENT_CLINIC_OR_DEPARTMENT_OTHER): Payer: Self-pay | Admitting: Family Medicine

## 2023-05-18 ENCOUNTER — Other Ambulatory Visit (HOSPITAL_BASED_OUTPATIENT_CLINIC_OR_DEPARTMENT_OTHER): Payer: Self-pay | Admitting: Family Medicine

## 2023-06-12 ENCOUNTER — Other Ambulatory Visit (HOSPITAL_BASED_OUTPATIENT_CLINIC_OR_DEPARTMENT_OTHER): Payer: Self-pay | Admitting: Family Medicine

## 2023-07-04 ENCOUNTER — Encounter: Payer: Self-pay | Admitting: Family Medicine

## 2023-07-04 ENCOUNTER — Telehealth: Payer: Self-pay

## 2023-07-04 ENCOUNTER — Telehealth (INDEPENDENT_AMBULATORY_CARE_PROVIDER_SITE_OTHER): Payer: Managed Care, Other (non HMO) | Admitting: Family Medicine

## 2023-07-04 DIAGNOSIS — J069 Acute upper respiratory infection, unspecified: Secondary | ICD-10-CM

## 2023-07-04 DIAGNOSIS — R051 Acute cough: Secondary | ICD-10-CM

## 2023-07-04 DIAGNOSIS — B9689 Other specified bacterial agents as the cause of diseases classified elsewhere: Secondary | ICD-10-CM | POA: Diagnosis not present

## 2023-07-04 MED ORDER — PROMETHAZINE-DM 6.25-15 MG/5ML PO SYRP
5.0000 mL | ORAL_SOLUTION | Freq: Four times a day (QID) | ORAL | 0 refills | Status: DC | PRN
Start: 2023-07-04 — End: 2024-02-29

## 2023-07-04 MED ORDER — DOXYCYCLINE HYCLATE 100 MG PO CAPS
100.0000 mg | ORAL_CAPSULE | Freq: Two times a day (BID) | ORAL | 0 refills | Status: AC
Start: 2023-07-04 — End: 2023-07-11

## 2023-07-04 NOTE — Progress Notes (Unsigned)
Virtual Visit via Video Note  I connected with Jillian Mercer on 07/04/2023 at 1:42PM by a video enabled telemedicine application and verified that I am speaking with the correct person using two identifiers.  Patient Location: Home Provider Location: Office/Clinic  I discussed the limitations, risks, security, and privacy concerns of performing an evaluation and management service by video and the availability of in person appointments. I also discussed with the patient that there may be a patient responsible charge related to this service. The patient expressed understanding and agreed to proceed.  Subjective: PCP: Alyson Reedy, FNP  Chief Complaint  Patient presents with   URI     ROS: Per HPI  Current Outpatient Medications:    doxycycline (VIBRAMYCIN) 100 MG capsule, Take 1 capsule (100 mg total) by mouth 2 (two) times daily for 7 days., Disp: 14 capsule, Rfl: 0   promethazine-dextromethorphan (PROMETHAZINE-DM) 6.25-15 MG/5ML syrup, Take 5 mLs by mouth 4 (four) times daily as needed for cough (Maximum dose: 30mL in 24 hours)., Disp: 118 mL, Rfl: 0   acetaminophen (TYLENOL) 500 MG tablet, Take 500-1,000 mg by mouth every 6 (six) hours as needed for mild pain or fever. , Disp: , Rfl:    albuterol (PROAIR HFA) 108 (90 Base) MCG/ACT inhaler, Inhale 2 puffs into the lungs every 6 (six) hours as needed for wheezing or shortness of breath., Disp: , Rfl:    FLUoxetine (PROZAC) 40 MG capsule, TAKE 1 CAPSULE (40 MG TOTAL) BY MOUTH DAILY., Disp: 30 capsule, Rfl: 2   ibuprofen (ADVIL) 800 MG tablet, Take 1 tablet (800 mg total) by mouth 3 (three) times daily., Disp: 21 tablet, Rfl: 0   meclizine (ANTIVERT) 12.5 MG tablet, Take 1 tablet (12.5 mg total) by mouth 3 (three) times daily as needed for dizziness., Disp: 30 tablet, Rfl: 0   ondansetron (ZOFRAN-ODT) 4 MG disintegrating tablet, Take 4 mg by mouth every 8 (eight) hours as needed for nausea or vomiting., Disp: , Rfl:    OZEMPIC,  0.25 OR 0.5 MG/DOSE, 2 MG/3ML SOPN, Inject into the skin., Disp: , Rfl:    pantoprazole (PROTONIX) 40 MG tablet, Take 1 tablet by mouth daily., Disp: , Rfl:    QUEtiapine (SEROQUEL) 25 MG tablet, TAKE 1 TABLET BY MOUTH TWICE A DAY, Disp: 180 tablet, Rfl: 1   tiZANidine (ZANAFLEX) 4 MG tablet, TAKE 1 TABLET BY MOUTH EVERY 6 HOURS AS NEEDED FOR 30 DAYS, Disp: , Rfl:    tranexamic acid (LYSTEDA) 650 MG TABS tablet, Take 2 tablets (1,300 mg total) by mouth 3 (three) times daily. Take three times daily for up to 5 days during monthly menstruation., Disp: 30 tablet, Rfl: 0  Observations/Objective: There were no vitals filed for this visit.  General: Alert and oriented x 4. Speaking in clear and full sentences, no audible heavy breathing, no acute distress.  Sounds alert and appropriately interactive.  Appears well.  Face symmetric.  Extraocular movements intact.  Pupils equal and round.  No nasal flaring or accessory muscle use visualized.  Assessment and Plan: 1. Bacterial URI (Primary) Patient presents today for virtual visit with complaints of nasal congestion, fever/chills, sinus pain/pressure, headache, and lack of appetite. She reports that her family recently got over the flu and this is when she felt more nauseous and fatigued. Her symptoms started last Wednesday, more similar to flu-like symptoms, and are now presenting in her nasal passages.  Advised patient that it is difficult to assess her since this is a virtual visit.  Patient is in no acute distress and is able to speak in complete sentences without sounding short of breath. Denies chest pain, shortness of breath, wheezing, abdominal pain, confusion, weakness, urinary symptoms or productive cough. Patient verbalized understanding and understands to return to the office if her symptoms persist or worsen.  Based on current symptoms and duration, differential diagnoses include bacterial sinus infection or bacterial URI. Antibiotic sent to  pharmacy on file. Discussed patient to continue to manage symptoms with Advil and/or Tylenol for pain/fever. If symptoms continue, patient needs to return to office for in-person evaluation.  - doxycycline (VIBRAMYCIN) 100 MG capsule; Take 1 capsule (100 mg total) by mouth 2 (two) times daily for 7 days.  Dispense: 14 capsule; Refill: 0 - promethazine-dextromethorphan (PROMETHAZINE-DM) 6.25-15 MG/5ML syrup; Take 5 mLs by mouth 4 (four) times daily as needed for cough (Maximum dose: 30mL in 24 hours).  Dispense: 118 mL; Refill: 0  2. Acute cough Patient reports her cough is worse at night. Denies shob, wheezing, and stridor. Rx sent for cough medication.  - promethazine-dextromethorphan (PROMETHAZINE-DM) 6.25-15 MG/5ML syrup; Take 5 mLs by mouth 4 (four) times daily as needed for cough (Maximum dose: 30mL in 24 hours).  Dispense: 118 mL; Refill: 0   Follow Up Instructions: Return if symptoms worsen or fail to improve.   I discussed the assessment and treatment plan with the patient. The patient was provided an opportunity to ask questions, and all were answered. The patient agreed with the plan and demonstrated an understanding of the instructions.   The patient was advised to call back or seek an in-person evaluation if the symptoms worsen or if the condition fails to improve as anticipated.  The above assessment and management plan was discussed with the patient. The patient verbalized understanding of and has agreed to the management plan.   Alyson Reedy, FNP

## 2023-07-04 NOTE — Telephone Encounter (Signed)
Called patient with the intentions of pre check in. Was unable to reach patient. LVM requesting a return call.

## 2023-07-12 ENCOUNTER — Ambulatory Visit (HOSPITAL_BASED_OUTPATIENT_CLINIC_OR_DEPARTMENT_OTHER): Payer: Managed Care, Other (non HMO) | Admitting: Family Medicine

## 2023-08-03 ENCOUNTER — Encounter (HOSPITAL_BASED_OUTPATIENT_CLINIC_OR_DEPARTMENT_OTHER): Payer: Managed Care, Other (non HMO) | Admitting: Family Medicine

## 2023-08-25 ENCOUNTER — Other Ambulatory Visit (HOSPITAL_BASED_OUTPATIENT_CLINIC_OR_DEPARTMENT_OTHER): Payer: Self-pay | Admitting: Family Medicine

## 2023-09-21 ENCOUNTER — Encounter (HOSPITAL_BASED_OUTPATIENT_CLINIC_OR_DEPARTMENT_OTHER): Payer: Self-pay

## 2023-09-21 ENCOUNTER — Encounter (HOSPITAL_BASED_OUTPATIENT_CLINIC_OR_DEPARTMENT_OTHER): Admitting: Family Medicine

## 2023-09-23 ENCOUNTER — Other Ambulatory Visit (HOSPITAL_BASED_OUTPATIENT_CLINIC_OR_DEPARTMENT_OTHER): Payer: Self-pay | Admitting: Family Medicine

## 2023-09-26 ENCOUNTER — Encounter (HOSPITAL_BASED_OUTPATIENT_CLINIC_OR_DEPARTMENT_OTHER): Payer: Self-pay | Admitting: *Deleted

## 2023-10-23 ENCOUNTER — Other Ambulatory Visit (HOSPITAL_BASED_OUTPATIENT_CLINIC_OR_DEPARTMENT_OTHER): Payer: Self-pay | Admitting: Family Medicine

## 2023-11-16 ENCOUNTER — Other Ambulatory Visit: Payer: Self-pay

## 2023-11-16 ENCOUNTER — Emergency Department (HOSPITAL_BASED_OUTPATIENT_CLINIC_OR_DEPARTMENT_OTHER)
Admission: EM | Admit: 2023-11-16 | Discharge: 2023-11-17 | Disposition: A | Discharge status: Home / Self Care | Attending: Emergency Medicine | Admitting: Emergency Medicine

## 2023-11-16 DIAGNOSIS — N939 Abnormal uterine and vaginal bleeding, unspecified: Secondary | ICD-10-CM | POA: Diagnosis present

## 2023-11-16 DIAGNOSIS — N938 Other specified abnormal uterine and vaginal bleeding: Secondary | ICD-10-CM | POA: Diagnosis not present

## 2023-11-16 NOTE — ED Triage Notes (Signed)
 Heavy vaginal bleeding. Irregular menstruation x2 years. Comes in today due to being exceptionally heavy past several days. Denies light-headed feelings.

## 2023-11-17 LAB — CBC
HCT: 35.6 % — ABNORMAL LOW (ref 36.0–46.0)
Hemoglobin: 11.3 g/dL — ABNORMAL LOW (ref 12.0–15.0)
MCH: 26.3 pg (ref 26.0–34.0)
MCHC: 31.7 g/dL (ref 30.0–36.0)
MCV: 83 fL (ref 80.0–100.0)
Platelets: 346 10*3/uL (ref 150–400)
RBC: 4.29 MIL/uL (ref 3.87–5.11)
RDW: 16.3 % — ABNORMAL HIGH (ref 11.5–15.5)
WBC: 6.5 10*3/uL (ref 4.0–10.5)
nRBC: 0 % (ref 0.0–0.2)

## 2023-11-17 LAB — RESP PANEL BY RT-PCR (RSV, FLU A&B, COVID)  RVPGX2
Influenza A by PCR: NEGATIVE
Influenza B by PCR: NEGATIVE
Resp Syncytial Virus by PCR: NEGATIVE
SARS Coronavirus 2 by RT PCR: NEGATIVE

## 2023-11-17 LAB — HCG, SERUM, QUALITATIVE: Preg, Serum: NEGATIVE

## 2023-11-17 MED ORDER — MEDROXYPROGESTERONE ACETATE 10 MG PO TABS: 10.0000 mg | ORAL_TABLET | Freq: Every day | ORAL | 0 refills | Status: DC

## 2023-11-17 NOTE — ED Provider Notes (Signed)
 [facility]PART[facility] AT [facility] Provider Note   CSN: [id number] Arrival date & time: 11/16/23  2334     Patient presents with: Vaginal Bleeding   Jillian Mercer is a 38 y.o. female.   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  Patient presenting today with complaints of vaginal bleeding.  This has been ongoing for nearly 2 years.  She has a GYN doctor and was tried on oral contraceptives and given an IUD, however bleeding persists.  She denies to me that she is having any shortness of breath or lightheadedness.  She denies any abdominal pain.  She highly doubts the possibility of pregnancy.       Prior to Admission medications   Medication Sig Start Date End Date Taking? Authorizing Provider  acetaminophen  (TYLENOL ) 500 MG tablet Take 500-1,000 mg by mouth every 6 (six) hours as needed for mild pain or fever.     [provider]  albuterol  (PROAIR  HFA) 108 (90 Base) MCG/ACT inhaler Inhale 2 puffs into the lungs every 6 (six) hours as needed for wheezing or shortness of breath.    [provider]  FLUoxetine  (PROZAC ) 40 MG capsule TAKE 1 CAPSULE (40 MG TOTAL) BY MOUTH DAILY. 09/26/23   Caudle, Arcola Kocher, FNP  ibuprofen  (ADVIL ) 800 MG tablet Take 1 tablet (800 mg total) by mouth 3 (three) times daily. 06/10/22   Sherel Dikes, PA-C  meclizine  (ANTIVERT ) 12.5 MG tablet Take 1 tablet (12.5 mg total) by mouth 3 (three) times daily as needed for dizziness. 12/09/22   Wilhelmena Hanson, FNP  ondansetron  (ZOFRAN -ODT) 4 MG disintegrating tablet Take 4 mg by mouth every 8 (eight) hours as needed for nausea or vomiting.    [provider]  OZEMPIC, 0.25 OR 0.5 MG/DOSE, 2 MG/3ML SOPN Inject into the skin. 12/04/22   [provider]  pantoprazole (PROTONIX) 40 MG tablet Take 1 tablet by mouth daily.    [provider]  promethazine -dextromethorphan (PROMETHAZINE -DM) 6.25-15 MG/5ML syrup Take 5 mLs by  mouth 4 (four) times daily as needed for cough (Maximum dose: 30mL in 24 hours). 07/04/23   Butler, Kristina, FNP  QUEtiapine  (SEROQUEL ) 25 MG tablet TAKE 1 TABLET BY MOUTH TWICE A DAY 10/25/23   Caudle, Arcola Kocher, FNP  tiZANidine (ZANAFLEX) 4 MG tablet TAKE 1 TABLET BY MOUTH EVERY 6 HOURS AS NEEDED FOR 30 DAYS    [provider]  tranexamic acid  (LYSTEDA ) 650 MG TABS tablet Take 2 tablets (1,300 mg total) by mouth 3 (three) times daily. Take three times daily for up to 5 days during monthly menstruation. 02/09/23   Wilhelmena Hanson, FNP    Allergies: Penicillins    Review of Systems  All other systems reviewed and are negative.   Updated Vital Signs BP 110/66 (BP Location: Right Arm)   Pulse 75   Temp 98.5 F (36.9 C) (Oral)   Resp 18   SpO2 99%   Physical Exam Vitals and nursing note reviewed.  Constitutional:      General: She is not in acute distress.    Appearance: She is well-developed. She is not diaphoretic.  HENT:     Head: Normocephalic and atraumatic.   Cardiovascular:     Rate and Rhythm: Normal rate and regular rhythm.     Heart sounds: No murmur heard.    No friction rub. No gallop.  Pulmonary:     Effort: Pulmonary effort is normal. No respiratory distress.     Breath sounds: Normal  breath sounds. No wheezing.  Abdominal:     General: Bowel sounds are normal. There is no distension.     Palpations: Abdomen is soft.     Tenderness: There is no abdominal tenderness.   Musculoskeletal:        General: Normal range of motion.     Cervical back: Normal range of motion and neck supple.   Skin:    General: Skin is warm and dry.   Neurological:     General: No focal deficit present.     Mental Status: She is alert and oriented to person, place, and time.     (all labs ordered are listed, but only abnormal results are displayed) Labs Reviewed  RESP PANEL BY RT-PCR (RSV, FLU A&B, COVID)  [id number]  CBC  HCG, SERUM, QUALITATIVE     EKG: None  Radiology: No results found.   Procedures   Medications Ordered in the ED - No data to display                                  Medical Decision Making Amount and/or Complexity of Data Reviewed Labs: ordered.   Patient is a 38 year old female presenting with vaginal bleeding as described in the HPI.  This has been an ongoing problem for the past 2 years.  Patient arrives here with stable vital signs and is afebrile.  She is not actively bleeding.  Physical examination unremarkable.  Laboratory studies obtained including CBC, basic metabolic panel, and pregnancy test.  Hemoglobin is 11.3 and pregnancy test is negative.  Remainder of laboratory studies unremarkable.  At this point, I feel as though patient can safely be discharged.  I will start her on Provera and have her follow-up with her GYN in the next few days.     Final diagnoses:  None    ED Discharge Orders     None          Orvilla Blander, MD 11/17/23 [id number]

## 2023-11-17 NOTE — Discharge Instructions (Addendum)
 Begin taking Provera as prescribed.  Call your GYN in the morning to arrange a follow-up appointment in the next few days.

## 2023-12-14 ENCOUNTER — Other Ambulatory Visit: Payer: Self-pay | Admitting: Family Medicine

## 2023-12-14 MED ORDER — FLUOXETINE HCL 40 MG PO CAPS: 40.0000 mg | ORAL_CAPSULE | Freq: Every day | ORAL | 0 refills | Status: DC

## 2023-12-14 MED ORDER — QUETIAPINE FUMARATE 25 MG PO TABS: 25.0000 mg | ORAL_TABLET | Freq: Two times a day (BID) | ORAL | 2 refills | Status: AC

## 2023-12-14 NOTE — Telephone Encounter (Signed)
 Copied from CRM [id number]. Topic: Clinical - Medication Refill >> Dec 14, 2023  3:37 PM Carlatta H wrote: Medication: FLUoxetine  (PROZAC ) 40 MG capsule QUEtiapine  (SEROQUEL ) 25 MG tablet   Has the patient contacted their pharmacy? No (Agent: If no, request that the patient contact the pharmacy for the refill. If patient does not wish to contact the pharmacy document the reason why and proceed with request.) (Agent: If yes, when and what did the pharmacy advise?)  This is the patient's preferred pharmacy:   [id number] - [place], [place] - [address] AT [facility] [address] [place] [address] Phone: [phone] Fax: [phone] Hours: Open 24 hours    Is this the correct pharmacy for this prescription? Yes If no, delete pharmacy and type the correct one.   Has the prescription been filled recently? No  Is the patient out of the medication? Yes  Has the patient been seen for an appointment in the last year OR does the patient have an upcoming appointment? Yes  Can we respond through MyChart? Yes  Agent: Please be advised that Rx refills may take up to 3 business days. We ask that you follow-up with your pharmacy.

## 2023-12-14 NOTE — Telephone Encounter (Signed)
 Will send in 1 refill for patient; she will need appt for further refills.

## 2023-12-19 ENCOUNTER — Encounter (HOSPITAL_BASED_OUTPATIENT_CLINIC_OR_DEPARTMENT_OTHER): Payer: Self-pay | Admitting: *Deleted

## 2023-12-19 DIAGNOSIS — F319 Bipolar disorder, unspecified: Secondary | ICD-10-CM | POA: Insufficient documentation

## 2024-01-26 ENCOUNTER — Ambulatory Visit (INDEPENDENT_AMBULATORY_CARE_PROVIDER_SITE_OTHER): Admitting: Physician Assistant

## 2024-01-26 ENCOUNTER — Encounter (HOSPITAL_BASED_OUTPATIENT_CLINIC_OR_DEPARTMENT_OTHER): Payer: Self-pay | Admitting: Physician Assistant

## 2024-01-26 ENCOUNTER — Ambulatory Visit (INDEPENDENT_AMBULATORY_CARE_PROVIDER_SITE_OTHER)

## 2024-01-26 ENCOUNTER — Other Ambulatory Visit (HOSPITAL_BASED_OUTPATIENT_CLINIC_OR_DEPARTMENT_OTHER): Payer: Self-pay

## 2024-01-26 DIAGNOSIS — M79641 Pain in right hand: Secondary | ICD-10-CM

## 2024-01-26 DIAGNOSIS — M25531 Pain in right wrist: Secondary | ICD-10-CM

## 2024-01-26 DIAGNOSIS — M25532 Pain in left wrist: Secondary | ICD-10-CM | POA: Diagnosis not present

## 2024-01-26 DIAGNOSIS — M79642 Pain in left hand: Secondary | ICD-10-CM

## 2024-01-26 MED ORDER — PREDNISONE 10 MG (21) PO TBPK
ORAL_TABLET | ORAL | 0 refills | Status: DC
  Filled 2024-01-26: qty 21, 6d supply, fill #0

## 2024-01-26 NOTE — Progress Notes (Signed)
 Chief Complaint: Bilateral wrist pain     History of Present Illness:    Jillian Mercer is a 38 y.o. female presents to [facility] with 1 month history of bilateral wrist pain.  Today right worse than left.  Patient states both wrists wax and wane in severity.  Reports pain primarily at base of thumb.  Exacerbated with use of hands and gripping.  Causing difficulty writing.  Pain began in 2019 while pregnant with child.  States she was seen at [facility] clinic and received a steroid injection in the right wrist, believes for de Quervain's tenosynovitis, provided significant relief.  Patient denies knowledge of ever having x-ray, MRI, or EMG.  Patient has not used over-the-counter splints/braces.  Patient has been taking over-the-counter Aleve and ibuprofen  with minimal relief.  Patient works on the computer constantly typing all day for work, works as a Energy manager.    PMH/PSH/Family History/Social History/Meds/Allergies:    Past Medical History:  Diagnosis Date   Preterm premature rupture of membranes (PPROM) with unknown onset of labor 07/28/2018   Vertigo    Past Surgical History:  Procedure Laterality Date   CESAREAN SECTION  2007   Social History   Socioeconomic History   Marital status: Single    Spouse name: Not on file   Number of children: Not on file   Years of education: Not on file   Highest education level: Associate degree: occupational, Scientist, product/process development, or vocational program  Occupational History   Not on file  Tobacco Use   Smoking status: Never    Passive exposure: Never   Smokeless tobacco: Never  Vaping Use   Vaping status: Never Used  Substance and Sexual Activity   Alcohol use: No   Drug use: No   Sexual activity: Yes    Birth control/protection: I.U.D.  Other Topics Concern   Not on file  Social History Narrative   Single   Lives with fiance and her children    3 children   OCCUPATION: stay at home mom   TRAVEL: [place] Nov 2017 for 1 week   Social  Drivers of Corporate investment banker Strain: Low Risk  (01/06/2023)   Overall Financial Resource Strain (CARDIA)    Difficulty of Paying Living Expenses: Not very hard  Food Insecurity: No Food Insecurity (01/06/2023)   Hunger Vital Sign    Worried About Running Out of Food in the Last Year: Never true    Ran Out of Food in the Last Year: Never true  Transportation Needs: No Transportation Needs (01/06/2023)   PRAPARE - Administrator, Civil Service (Medical): No    Lack of Transportation (Non-Medical): No  Physical Activity: Unknown (01/06/2023)   Exercise Vital Sign    Days of Exercise per Week: 0 days    Minutes of Exercise per Session: Not on file  Stress: Stress Concern Present (01/06/2023)   [employer] of Occupational Health - Occupational Stress Questionnaire    Feeling of Stress : To some extent  Social Connections: Moderately Isolated (01/06/2023)   Social Connection and Isolation Panel    Frequency of Communication with Friends and Family: More than three times a week    Frequency of Social Gatherings with Friends and Family: More than three times a week    Attends Religious Services: Never    Database administrator or Organizations: No    Attends Engineer, structural: Not on file    Marital Status: Married   Family  History  Problem Relation Age of Onset   Lupus Mother    Skin cancer Maternal Grandmother    Hypertension Maternal Grandmother    Allergies  Allergen Reactions   Penicillins Rash    Did it involve swelling of the face/tongue/throat, SOB, or low BP? Yes Did it involve sudden or severe rash/hives, skin peeling, or any reaction on the inside of your mouth or nose? Unk Did you need to seek medical attention at a hospital or doctor's office? Unk When did it last happen? I was little   If all above answers are "NO", may proceed with cephalosporin use.    Current Outpatient Medications  Medication Sig Dispense Refill   acetaminophen   (TYLENOL ) 500 MG tablet Take 500-1,000 mg by mouth every 6 (six) hours as needed for mild pain or fever.      albuterol  (PROAIR  HFA) 108 (90 Base) MCG/ACT inhaler Inhale 2 puffs into the lungs every 6 (six) hours as needed for wheezing or shortness of breath.     FLUoxetine  (PROZAC ) 40 MG capsule Take 1 capsule (40 mg total) by mouth daily. 90 capsule 0   ibuprofen  (ADVIL ) 800 MG tablet Take 1 tablet (800 mg total) by mouth 3 (three) times daily. 21 tablet 0   meclizine  (ANTIVERT ) 12.5 MG tablet Take 1 tablet (12.5 mg total) by mouth 3 (three) times daily as needed for dizziness. 30 tablet 0   medroxyPROGESTERone  (PROVERA ) 10 MG tablet Take 1 tablet (10 mg total) by mouth daily. 10 tablet 0   ondansetron  (ZOFRAN -ODT) 4 MG disintegrating tablet Take 4 mg by mouth every 8 (eight) hours as needed for nausea or vomiting.     OZEMPIC, 0.25 OR 0.5 MG/DOSE, 2 MG/3ML SOPN Inject into the skin.     pantoprazole (PROTONIX) 40 MG tablet Take 1 tablet by mouth daily.     promethazine -dextromethorphan (PROMETHAZINE -DM) 6.25-15 MG/5ML syrup Take 5 mLs by mouth 4 (four) times daily as needed for cough (Maximum dose: 30mL in 24 hours). 118 mL 0   QUEtiapine  (SEROQUEL ) 25 MG tablet Take 1 tablet (25 mg total) by mouth 2 (two) times daily. 60 tablet 2   tiZANidine (ZANAFLEX) 4 MG tablet TAKE 1 TABLET BY MOUTH EVERY 6 HOURS AS NEEDED FOR 30 DAYS     tranexamic acid  (LYSTEDA ) 650 MG TABS tablet Take 2 tablets (1,300 mg total) by mouth 3 (three) times daily. Take three times daily for up to 5 days during monthly menstruation. 30 tablet 0   No current facility-administered medications for this visit.   No results found.  Review of Systems:   A ROS was performed including pertinent positives and negatives as documented in the HPI.  Physical Exam :   Constitutional: NAD and appears stated age Neurological: Alert and oriented Psych: Appropriate affect and cooperative unknown if currently breastfeeding.    Comprehensive Musculoskeletal Exam:    Bilateral wrists normal to inspection. No erythema. No edema. No ecchymosis. No gross deformity.  Right wrist: Point tenderness with palpation at 1st MCP and CMC. Pain with thumb flexion and extension.  Left wrist: Mild tenderness with palpation along radial aspect of 1st phalanx.   Negative Phalen test bilaterally. Positive Tinel test bilaterally. Left wrist with positive Finkelstein test. Right wrist negative Finkelstein test.  No thenar or hypothenar eminence wasting present. 5/5 motor strength. Sensation intact. Brisk capillary refill. Strong radial pulses.   Imaging:   Xray (Wrist Complete Right and Left): No obvious fracture or other bony abnormalities. The radiocarpal and intercarpal  joints have a normal appearance.  I personally reviewed and interpreted the radiographs.   Assessment and Plan:   38 y.o. female with bilateral wrist/hand pain. Acute on chronic. Present for years. Current episode x 1 month. Right wrist currently symptomatic; pain located at 1st MCP joint. Left wrist currently asymptomatic but with pain along thumb abductor tendons and positive Finkelstein test. Patient with bilateral positive Tinel test and occupational history of frequent typing. Believe patient has combo of de Quervain's tenosynovitis and carpal tunnel. Prescribed Sterapred course. Placed patient in thumb spica splint. Placed referral for patient to be seen by Dr. Erwin, hand specialist, for further evaluation and treatment. Discussed future next steps of possible MRI, EMG, steroid injection, and surgical correction.   I personally saw and evaluated the patient, and participated in the management and treatment plan.  Lucie Stabs, PA-C Orthopedic Surgery  This document was dictated using Dragon voice recognition software. A reasonable attempt at proof reading has been made to minimize errors.

## 2024-02-02 ENCOUNTER — Ambulatory Visit: Admitting: Family Medicine

## 2024-02-07 ENCOUNTER — Telehealth: Payer: Self-pay | Admitting: Orthopedic Surgery

## 2024-02-07 NOTE — Telephone Encounter (Signed)
 Called pt and left vm for pt to call and make an appt with Dr Erwin for Bil hands. Please attach referral.

## 2024-02-15 ENCOUNTER — Telehealth: Payer: Self-pay | Admitting: Orthopedic Surgery

## 2024-02-15 NOTE — Telephone Encounter (Signed)
 Called this pt 3 X and left vm for pt to make an appt for Bilateral hand pain with Erwin

## 2024-02-29 ENCOUNTER — Ambulatory Visit (INDEPENDENT_AMBULATORY_CARE_PROVIDER_SITE_OTHER): Admitting: Family Medicine

## 2024-02-29 ENCOUNTER — Encounter (HOSPITAL_BASED_OUTPATIENT_CLINIC_OR_DEPARTMENT_OTHER): Payer: Self-pay | Admitting: Family Medicine

## 2024-02-29 DIAGNOSIS — K146 Glossodynia: Secondary | ICD-10-CM | POA: Diagnosis not present

## 2024-02-29 DIAGNOSIS — Z23 Encounter for immunization: Secondary | ICD-10-CM | POA: Diagnosis not present

## 2024-02-29 MED ORDER — NYSTATIN 100000 UNIT/ML MT SUSP: 500000.0000 [IU] | Freq: Four times a day (QID) | OROMUCOSAL | 0 refills | Status: AC

## 2024-02-29 NOTE — Progress Notes (Signed)
 Subjective:   Jillian Mercer [birth year] 02/29/2024  Chief Complaint  Patient presents with   New Patient (Initial Visit)    Patient is here today for transfer of care appt and is requesting a referral to have gastric sleeve done. Also has been having some pain in her mouth which she describes as burning mouth syndrome.    Discussed the use of AI scribe software for clinical note transcription with the patient, who gave verbal consent to proceed.  History of Present Illness Jillian Mercer is a 38 year old female who presents with concerns about weight management and burning mouth syndrome. Accompanied by her husband. Patient presents today for transfer of care from Evalene Arts, NP.   OBESITY:  She is asking benefits and risks of gastric sleeve surgery and general bariatric surgery procedures. She has previously used medications such as Ozempic and Mounjaro for weight loss, with Mounjaro being the most effective, resulting in a weight loss of about 15 to 20 pounds over a six-month period. However, she finds Mounjaro too expensive and is unable to afford it. She has no history of diabetes, including gestational diabetes, and her last A1c in July was normal. She has not tried Zepbound or Wegovy, which are similar medications covered for weight loss. Her weight has been a significant stressor, and she is interested in exploring options that can help curb her appetite and facilitate weight loss.  BURNING MOUTH SYNDROME:  She has been experiencing burning mouth syndrome for several months following a bout of strep throat. After antibiotic treatment for strep throat, she developed thrush and was treated for oropharyngeal candidiasis. She experiences burning and pain in her mouth when eating certain foods but does not see any lesions or have any drainage or bleeding. No gum bleeding or dental issues, and she has consulted a dentist in the past. She is not using any over-the-counter products for  this condition.  Her social history includes smoking marijuana for bipolar disorder and depression. Her family member notes that she works extensively, often skipping meals during the day and eating late at night.    The following portions of the patient's history were reviewed and updated as appropriate: past medical history, past surgical history, family history, social history, allergies, medications, and problem list.   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   Bipolar I disorder (HCC) 12/19/2023   Menorrhagia with irregular cycle 02/09/2023   Morbid obesity (HCC) 12/12/2022   Vertigo 12/12/2022   Abnormal uterine bleeding (AUB) 12/12/2022   Chronic midline low back pain without sciatica 12/12/2022   Past Medical History:  Diagnosis Date   Depression    Preterm premature rupture of membranes (PPROM) with unknown onset of labor 07/28/2018   Vertigo    Past Surgical History:  Procedure Laterality Date   CESAREAN SECTION  2007   Family History  Problem Relation Age of Onset   Lupus Mother    Skin cancer Maternal Grandmother    Hypertension Maternal Grandmother    Outpatient Medications Prior to Visit  Medication Sig Dispense Refill   acetaminophen  (TYLENOL ) 500 MG tablet Take 500-1,000 mg by mouth every 6 (six) hours as needed for mild pain or fever.      albuterol  (PROAIR  HFA) 108 (90 Base) MCG/ACT inhaler Inhale 2 puffs into the lungs every 6 (six) hours as needed for wheezing or shortness of breath.     FLUoxetine  (PROZAC ) 40 MG capsule Take 1  capsule (40 mg total) by mouth daily. 90 capsule 0   ibuprofen  (ADVIL ) 800 MG tablet Take 1 tablet (800 mg total) by mouth 3 (three) times daily. 21 tablet 0   meclizine  (ANTIVERT ) 12.5 MG tablet Take 1 tablet (12.5 mg total) by mouth 3 (three) times daily as needed for dizziness. 30 tablet 0   ondansetron  (ZOFRAN -ODT) 4 MG disintegrating tablet Take 4 mg by mouth every 8 (eight) hours  as needed for nausea or vomiting.     QUEtiapine  (SEROQUEL ) 25 MG tablet Take 1 tablet (25 mg total) by mouth 2 (two) times daily. 60 tablet 2   tiZANidine (ZANAFLEX) 4 MG tablet TAKE 1 TABLET BY MOUTH EVERY 6 HOURS AS NEEDED FOR 30 DAYS     medroxyPROGESTERone  (PROVERA ) 10 MG tablet Take 1 tablet (10 mg total) by mouth daily. 10 tablet 0   OZEMPIC, 0.25 OR 0.5 MG/DOSE, 2 MG/3ML SOPN Inject into the skin.     pantoprazole (PROTONIX) 40 MG tablet Take 1 tablet by mouth daily.     predniSONE  (STERAPRED UNI-PAK 21 TAB) 10 MG (21) TBPK tablet Use as directed on pack for 6 days. 21 each 0   promethazine -dextromethorphan (PROMETHAZINE -DM) 6.25-15 MG/5ML syrup Take 5 mLs by mouth 4 (four) times daily as needed for cough (Maximum dose: 30mL in 24 hours). 118 mL 0   tranexamic acid  (LYSTEDA ) 650 MG TABS tablet Take 2 tablets (1,300 mg total) by mouth 3 (three) times daily. Take three times daily for up to 5 days during monthly menstruation. 30 tablet 0   No facility-administered medications prior to visit.   Allergies  Allergen Reactions   Percocet [Oxycodone -Acetaminophen ] Other (See Comments)    States she forgets to breathe when taking med   Penicillins Rash    Did it involve swelling of the face/tongue/throat, SOB, or low BP? Yes Did it involve sudden or severe rash/hives, skin peeling, or any reaction on the inside of your mouth or nose? Unk Did you need to seek medical attention at a hospital or doctor's office? Unk When did it last happen? I was little   If all above answers are "NO", may proceed with cephalosporin use.      ROS: A complete ROS was performed with pertinent positives/negatives noted in the HPI. The remainder of the ROS are negative.    Objective:   Today's Vitals   02/29/24 1416  BP: 115/61  Pulse: 63  SpO2: 100%  Weight: 281 lb 8 oz (127.7 kg)  Height: 5' 5 (1.651 m)    Physical Exam   GENERAL: Well-appearing, in NAD. Obese.  SKIN: Pink, warm and dry. No  rash, lesion, ulceration, or ecchymoses.  Head: Normocephalic. NECK: Trachea midline. Full ROM w/o pain or tenderness.  EYES: Conjunctiva clear without exudates. EOMI, PERRL, no drainage present.  NOSE: Septum midline w/o deformity. Nares patent, mucosa pink and non-inflamed w/o drainage. No sinus tenderness.  THROAT: Uvula midline. Oropharynx clear. Tongue has mild white coating to posterior surface of tongue. Mucous membranes pink and moist.  RESPIRATORY: Chest wall symmetrical. Respirations even and non-labored. MSK: Muscle tone and strength appropriate for age.  NEUROLOGIC: No motor or sensory deficits. Steady, even gait. C2-C12 intact.  PSYCH/MENTAL STATUS: Alert, oriented x 3. Cooperative, appropriate mood and affect.   Health Maintenance Due  Topic Date Due   Hepatitis C Screening  Never done   HPV VACCINES (1 - 3-dose SCDM series) Never done   Influenza Vaccine  12/30/2023   COVID-19 Vaccine (3 -  2025-26 season) 01/30/2024    No results found for any visits on 02/29/24.  The ASCVD Risk score (Arnett DK, et al., 2019) failed to calculate for the following reasons:   The 2019 ASCVD risk score is only valid for ages 19 to 65     Assessment & Plan:  1. Immunization due Flu vaccine given in office today.   2. Morbid obesity (HCC) (Primary) Discussed benefits and risks of weight management surgery and GLP1s. Recommend dietary changes and discussion with MWM. Will obtain labs to rule out comorbid conditions with obesity today. Pt to check into insurance coverage regarding GLP 1 coverage.  - Amb Ref to Medical Weight Management - Comprehensive metabolic panel with GFR - Lipid panel - TSH - Hemoglobin A1c  3. Burning mouth syndrome Discussed contributing factors such as residual thrush, dry mouth, tobacco use and GERD. Recommend continuing PPI therapy, try nystatin suspension. If no improvement, will trial Biotene.   Meds ordered this encounter  Medications   nystatin  (MYCOSTATIN) 100000 UNIT/ML suspension    Sig: Take 5 mLs (500,000 Units total) by mouth 4 (four) times daily. Swish for 30 seconds and spit out.    Dispense:  180 mL    Refill:  0    Supervising Provider:   DE PERU, RAYMOND J [[id number]]   Lab Orders         Comprehensive metabolic panel with GFR         Lipid panel         TSH         Hemoglobin A1c     Return in about 3 months (around 05/31/2024) for ANNUAL PHYSICAL.    Patient to reach out to office if new, worrisome, or unresolved symptoms arise or if no improvement in patient's condition. Patient verbalized understanding and is agreeable to treatment plan. All questions answered to patient's satisfaction.    Thersia Schuyler Stark, [place]

## 2024-02-29 NOTE — Patient Instructions (Signed)
 Zepbound Wegovy Saxxenda

## 2024-03-01 LAB — COMPREHENSIVE METABOLIC PANEL WITH GFR
ALT: 11 IU/L (ref 0–32)
AST: 14 IU/L (ref 0–40)
Albumin: 4.2 g/dL (ref 3.9–4.9)
Alkaline Phosphatase: 109 IU/L (ref 41–116)
BUN/Creatinine Ratio: 8 — ABNORMAL LOW (ref 9–23)
BUN: 9 mg/dL (ref 6–20)
Bilirubin Total: 0.2 mg/dL (ref 0.0–1.2)
CO2: 20 mmol/L (ref 20–29)
Calcium: 8.7 mg/dL (ref 8.7–10.2)
Chloride: 104 mmol/L (ref 96–106)
Creatinine, Ser: 1.11 mg/dL — ABNORMAL HIGH (ref 0.57–1.00)
Globulin, Total: 2.8 g/dL (ref 1.5–4.5)
Glucose: 80 mg/dL (ref 70–99)
Potassium: 4.5 mmol/L (ref 3.5–5.2)
Sodium: 139 mmol/L (ref 134–144)
Total Protein: 7 g/dL (ref 6.0–8.5)
eGFR: 66 mL/min/1.73 (ref >59)

## 2024-03-01 LAB — LIPID PANEL
Chol/HDL Ratio: 5.9 ratio — ABNORMAL HIGH (ref 0.0–4.4)
Cholesterol, Total: 182 mg/dL (ref 100–199)
HDL: 31 mg/dL — ABNORMAL LOW (ref >39)
LDL Chol Calc (NIH): 134 mg/dL — ABNORMAL HIGH (ref 0–99)
Triglycerides: 92 mg/dL (ref 0–149)
VLDL Cholesterol Cal: 17 mg/dL (ref 5–40)

## 2024-03-01 LAB — TSH: TSH: 2.37 u[IU]/mL (ref 0.450–4.500)

## 2024-03-01 LAB — HEMOGLOBIN A1C
Est. average glucose Bld gHb Est-mCnc: 103 mg/dL
Hgb A1c MFr Bld: 5.2 % (ref 4.8–5.6)

## 2024-03-04 ENCOUNTER — Ambulatory Visit (HOSPITAL_BASED_OUTPATIENT_CLINIC_OR_DEPARTMENT_OTHER): Payer: Self-pay | Admitting: Family Medicine

## 2024-03-04 DIAGNOSIS — N182 Chronic kidney disease, stage 2 (mild): Secondary | ICD-10-CM | POA: Insufficient documentation

## 2024-03-04 NOTE — Progress Notes (Signed)
 Hi Starlit,  Your Ldl cholesterol is elevated. Making diet changes and regular exercise can improve this. Your A1c is normal and does not indicate presence of diabetes. Your thyroid function is stable. Your kidney function has been slightly decreased for several years from review. Increasing your water intake daily and avoiding frequent use of NSAIDS such as ibuprofen  (advil ), naproxen (aleve) and monitoring protein intake can help. Your kidney function has not declined since last year. I would recommend keeping a closer eye on kidney function (every 4-6 months). If you agreeable to this, please let me know.

## 2024-03-05 ENCOUNTER — Telehealth (HOSPITAL_BASED_OUTPATIENT_CLINIC_OR_DEPARTMENT_OTHER): Payer: Self-pay | Admitting: *Deleted

## 2024-03-05 NOTE — Telephone Encounter (Signed)
 Copied from CRM [id number]. Topic: Clinical - Medication Prior Auth >> Mar 05, 2024  3:03 PM Wess RAMAN wrote: Reason for CRM: Patient stated her insurance has approved the prior authorization for [facility] and like it sent the pharmacy  Callback #: [phone]  Pharmacy: [id number] - [place], [place] - [address] AT [facility] [address] [place] [address] Phone: [phone] Fax: [phone] Hours: Open 24 hours

## 2024-03-05 NOTE — Telephone Encounter (Signed)
Routing to Amanda for review.

## 2024-03-06 ENCOUNTER — Other Ambulatory Visit (HOSPITAL_BASED_OUTPATIENT_CLINIC_OR_DEPARTMENT_OTHER): Payer: Self-pay | Admitting: Family Medicine

## 2024-03-06 MED ORDER — SEMAGLUTIDE-WEIGHT MANAGEMENT 0.25 MG/0.5ML ~~LOC~~ SOAJ: SUBCUTANEOUS | 3 refills | Status: AC

## 2024-03-12 NOTE — Telephone Encounter (Signed)
 Pt has follow up scheduled 05/2024.

## 2024-03-13 ENCOUNTER — Institutional Professional Consult (permissible substitution) (INDEPENDENT_AMBULATORY_CARE_PROVIDER_SITE_OTHER): Admitting: Adult Health

## 2024-03-14 NOTE — Telephone Encounter (Signed)
 Please see messages sent by pt about PA denial.

## 2024-03-22 ENCOUNTER — Institutional Professional Consult (permissible substitution) (INDEPENDENT_AMBULATORY_CARE_PROVIDER_SITE_OTHER): Admitting: Adult Health

## 2024-03-29 ENCOUNTER — Other Ambulatory Visit (HOSPITAL_BASED_OUTPATIENT_CLINIC_OR_DEPARTMENT_OTHER): Payer: Self-pay | Admitting: Family Medicine

## 2024-03-29 MED ORDER — FLUOXETINE HCL 40 MG PO CAPS: 40.0000 mg | ORAL_CAPSULE | Freq: Every day | ORAL | 2 refills | Status: AC

## 2024-03-29 NOTE — Telephone Encounter (Signed)
 Copied from CRM [id number]. Topic: Clinical - Medication Refill >> Mar 29, 2024  3:55 PM Darshell M wrote: Medication:  FLUoxetine  (PROZAC ) 40 MG capsule   Has the patient contacted their pharmacy? Yes (Agent: If no, request that the patient contact the pharmacy for the refill. If patient does not wish to contact the pharmacy document the reason why and proceed with request.) (Agent: If yes, when and what did the pharmacy advise?)  This is the patient's preferred pharmacy:  [id number] - [place], [place] - [address] AT [facility] [address] [place] [address] Phone: [phone] Fax: [phone]  Is this the correct pharmacy for this prescription? Yes If no, delete pharmacy and type the correct one.   Has the prescription been filled recently? No  Is the patient out of the medication? Yes  Has the patient been seen for an appointment in the last year OR does the patient have an upcoming appointment? Yes  Can we respond through MyChart? Yes  Agent: Please be advised that Rx refills may take up to 3 business days. We ask that you follow-up with your pharmacy.

## 2024-04-02 ENCOUNTER — Encounter: Payer: Self-pay | Admitting: Radiology

## 2024-04-11 ENCOUNTER — Telehealth (HOSPITAL_BASED_OUTPATIENT_CLINIC_OR_DEPARTMENT_OTHER): Payer: Self-pay | Admitting: Family Medicine

## 2024-04-11 NOTE — Telephone Encounter (Signed)
 Copied from CRM [id number]. Topic: Medical Record Request - Other >> Apr 11, 2024  2:32 PM Avram MATSU wrote: Reason for CRM: patient is requesting her records stating she got her flu shot to be sent to her email. [email or web address]  Please call patient and let her know immunizations are available in mychart  under immunizations and need to come into the office if they need a hard copy.

## 2024-04-19 ENCOUNTER — Institutional Professional Consult (permissible substitution) (INDEPENDENT_AMBULATORY_CARE_PROVIDER_SITE_OTHER): Admitting: Adult Health

## 2024-05-02 NOTE — Telephone Encounter (Signed)
 Jillian Mercer, please advise.

## 2024-05-02 NOTE — Telephone Encounter (Signed)
 Copied from CRM #[id number]. Topic: Referral - Request for Referral >> May 02, 2024  2:14 PM Anairis L wrote: Did the patient discuss referral with their provider in the last year? No (If No - schedule appointment) (If Yes - send message)  Appointment offered? No  Type of order/referral and detailed reason for visit: OBGYN/Needs a specific  procedure done   Preference of office, provider, location: [facility]   If referral order, have you been seen by this specialty before? Yes (If Yes, this issue or another issue? When? Where?  Can we respond through MyChart? Yes   Patient is requesting a call back please.

## 2024-05-04 ENCOUNTER — Ambulatory Visit: Payer: Self-pay

## 2024-05-04 NOTE — Telephone Encounter (Signed)
 FYI Only or Action Required?: FYI only for provider: appointment scheduled on [facility] 05/05/2024.  Patient was last seen in primary care on 02/29/2024 by Knute Thersia Bitters, FNP.  Called Nurse Triage reporting Menstrual Problem.  Symptoms began several years ago.  Interventions attempted: Nothing.  Symptoms are: unchanged.  Triage Disposition: See PCP Within 2 Weeks  Patient/caregiver understands and will follow disposition?: Yes  Copied from CRM [id number]. Topic: Clinical - Red Word Triage >> May 04, 2024  4:34 PM Hadassah PARAS wrote: Red Word that prompted transfer to Nurse Triage: I have pt's husband on the line Jillian Mercer. Pt has been having mentrual cycles lasting 3 weeks, passing large blood clots where it begins to look like tissue. Transferred to NT Reason for Disposition  [1] Bleeding or spotting between regular periods AND [2] occurs more than three cycles (3 months) this past year  Answer Assessment - Initial Assessment Questions Patient's husband on phone. Wants referral to OBGYN. Message from provider shows they don't need referral and they can just go to OBGYN. States she is having a period 3 weeks a month. She stopped a period 4 days ago and started bleeding 2 days later. Going through roughly 4-5 tampons a day. Has been going on for years. Had been seen at [facility]. Tried an IUD and had helped but has been bad for a while now. Bleeding is the worst it's ever been. Unable to schedule at [facility] as she had lots of no-shows due to work. Minor weakness. Patient not taking anything for pain. Told him to contact OBGYN for appointment. Hard for patient to get in to offices because she works a lot. But to contact us  again if they can't get patient in anywhere soon. [facility] scheduled to check patient's for lab order to get blood checked. 1. BLEEDING SEVERITY: Describe the bleeding that you are having. How much bleeding is there?      4 days ago. Started another 3  days later. 2. ONSET: When did the bleeding begin? Is it continuing now?     Has been ongoing 3. MENSTRUAL PERIOD: When was the last normal menstrual period? How is this different than your period?     Now. Is constantly having a period. Same as has been, no change. Passing lots of clots. 4. REGULARITY: How regular are your periods?     Very irregular 5. ABDOMEN PAIN: Do you have any pain? How bad is the pain?  (e.g., Scale 0-10; none, mild, moderate, or severe)     Husband states she looks uncomfortable  Protocols used: Vaginal Bleeding - Abnormal-A-AH

## 2024-05-05 ENCOUNTER — Telehealth

## 2024-05-10 ENCOUNTER — Institutional Professional Consult (permissible substitution) (INDEPENDENT_AMBULATORY_CARE_PROVIDER_SITE_OTHER): Admitting: Nurse Practitioner

## 2024-05-10 ENCOUNTER — Telehealth (HOSPITAL_BASED_OUTPATIENT_CLINIC_OR_DEPARTMENT_OTHER): Payer: Self-pay | Admitting: *Deleted

## 2024-05-10 DIAGNOSIS — N921 Excessive and frequent menstruation with irregular cycle: Secondary | ICD-10-CM

## 2024-05-10 DIAGNOSIS — N939 Abnormal uterine and vaginal bleeding, unspecified: Secondary | ICD-10-CM

## 2024-05-10 NOTE — Telephone Encounter (Signed)
 Copied from CRM #[id number]. Topic: Referral - Request for Referral >> May 10, 2024  2:30 PM Emylou G wrote: Did the patient discuss referral with their provider in the last year? Yes (If No - schedule appointment) (If Yes - send message)  Appointment offered? No  Type of order/referral and detailed reason for visit: menstrual cycle  Preference of office, provider, location: [facility]  If referral order, have you been seen by this specialty before? No (If Yes, this issue or another issue? When? Where?  Can we respond through MyChart? Yes   Prefers female and afternoon appt

## 2024-05-10 NOTE — Telephone Encounter (Signed)
 Referral to OBGYN has been placed for pt.

## 2024-06-04 ENCOUNTER — Ambulatory Visit (HOSPITAL_BASED_OUTPATIENT_CLINIC_OR_DEPARTMENT_OTHER): Admitting: Family Medicine

## 2024-06-10 ENCOUNTER — Encounter (INDEPENDENT_AMBULATORY_CARE_PROVIDER_SITE_OTHER): Payer: Self-pay | Admitting: *Deleted

## 2024-06-11 ENCOUNTER — Institutional Professional Consult (permissible substitution) (INDEPENDENT_AMBULATORY_CARE_PROVIDER_SITE_OTHER): Admitting: Nurse Practitioner

## 2024-06-18 ENCOUNTER — Ambulatory Visit (HOSPITAL_BASED_OUTPATIENT_CLINIC_OR_DEPARTMENT_OTHER): Admitting: Family Medicine

## 2024-06-25 ENCOUNTER — Encounter (INDEPENDENT_AMBULATORY_CARE_PROVIDER_SITE_OTHER): Payer: Self-pay

## 2024-06-25 ENCOUNTER — Institutional Professional Consult (permissible substitution) (INDEPENDENT_AMBULATORY_CARE_PROVIDER_SITE_OTHER): Admitting: Nurse Practitioner
